# Patient Record
Sex: Female | Born: 1935 | Hispanic: No | Marital: Married | Smoking: Never smoker
Health system: Southern US, Community
[De-identification: ages and names within clinical notes are randomized; demographics above are authoritative.]

## PROBLEM LIST (undated history)

## (undated) DIAGNOSIS — N838 Other noninflammatory disorders of ovary, fallopian tube and broad ligament: Secondary | ICD-10-CM

## (undated) DIAGNOSIS — I10 Essential (primary) hypertension: Secondary | ICD-10-CM

## (undated) DIAGNOSIS — E785 Hyperlipidemia, unspecified: Secondary | ICD-10-CM

## (undated) DIAGNOSIS — C801 Malignant (primary) neoplasm, unspecified: Secondary | ICD-10-CM

## (undated) DIAGNOSIS — I509 Heart failure, unspecified: Secondary | ICD-10-CM

## (undated) HISTORY — PX: COLONOSCOPY: SHX174

## (undated) HISTORY — PX: COLONOSCOPY W/ BIOPSIES: SHX1374

## (undated) HISTORY — DX: Essential (primary) hypertension: I10

## (undated) HISTORY — DX: Malignant (primary) neoplasm, unspecified: C80.1

## (undated) HISTORY — DX: Heart failure, unspecified: I50.9

## (undated) HISTORY — PX: JOINT REPLACEMENT: SHX530

## (undated) HISTORY — DX: Other noninflammatory disorders of ovary, fallopian tube and broad ligament: N83.8

## (undated) HISTORY — DX: Hyperlipidemia, unspecified: E78.5

---

## 2011-02-24 ENCOUNTER — Emergency Department: Payer: Self-pay | Admitting: Emergency Medicine

## 2011-02-26 ENCOUNTER — Ambulatory Visit: Payer: Self-pay | Admitting: Oncology

## 2011-02-28 ENCOUNTER — Ambulatory Visit: Payer: Self-pay | Admitting: Oncology

## 2011-03-01 LAB — CEA: CEA: 32.2 ng/mL — ABNORMAL HIGH (ref 0.0–4.7)

## 2011-03-10 ENCOUNTER — Ambulatory Visit: Payer: Self-pay | Admitting: Unknown Physician Specialty

## 2011-03-13 ENCOUNTER — Ambulatory Visit: Payer: Self-pay | Admitting: Surgery

## 2011-03-14 ENCOUNTER — Ambulatory Visit: Payer: Self-pay | Admitting: Surgery

## 2011-03-21 ENCOUNTER — Inpatient Hospital Stay: Payer: Self-pay | Admitting: Surgery

## 2011-03-24 LAB — PATHOLOGY REPORT

## 2011-03-28 ENCOUNTER — Ambulatory Visit: Payer: Self-pay | Admitting: Oncology

## 2011-04-10 ENCOUNTER — Ambulatory Visit: Payer: Self-pay | Admitting: Surgery

## 2011-04-24 ENCOUNTER — Ambulatory Visit: Payer: Self-pay | Admitting: Oncology

## 2011-04-28 ENCOUNTER — Ambulatory Visit: Payer: Self-pay | Admitting: Oncology

## 2011-05-28 ENCOUNTER — Ambulatory Visit: Payer: Self-pay | Admitting: Oncology

## 2011-06-28 ENCOUNTER — Ambulatory Visit: Payer: Self-pay | Admitting: Oncology

## 2011-07-05 LAB — CEA: CEA: 4.5 ng/mL (ref 0.0–4.7)

## 2011-07-29 ENCOUNTER — Ambulatory Visit: Payer: Self-pay | Admitting: Oncology

## 2011-08-28 ENCOUNTER — Ambulatory Visit: Payer: Self-pay | Admitting: Oncology

## 2011-09-28 ENCOUNTER — Ambulatory Visit: Payer: Self-pay | Admitting: Oncology

## 2011-10-20 ENCOUNTER — Inpatient Hospital Stay: Payer: Self-pay | Admitting: Oncology

## 2011-10-23 LAB — CEA: CEA: 7.2 ng/mL — ABNORMAL HIGH (ref 0.0–4.7)

## 2011-10-23 LAB — CA 125: CA 125: 43.6 U/mL — ABNORMAL HIGH (ref 0.0–34.0)

## 2011-10-28 ENCOUNTER — Ambulatory Visit: Payer: Self-pay | Admitting: Oncology

## 2011-11-28 ENCOUNTER — Ambulatory Visit: Payer: Self-pay | Admitting: Oncology

## 2011-12-05 LAB — CBC CANCER CENTER
Basophil #: 0.1 x10 3/mm (ref 0.0–0.1)
Eosinophil #: 0 x10 3/mm (ref 0.0–0.7)
Eosinophil %: 0.5 %
HCT: 30.1 % — ABNORMAL LOW (ref 35.0–47.0)
HGB: 10.3 g/dL — ABNORMAL LOW (ref 12.0–16.0)
Lymphocyte #: 1.3 x10 3/mm (ref 1.0–3.6)
Lymphocyte %: 19.5 %
MCH: 32.9 pg (ref 26.0–34.0)
MCV: 97 fL (ref 80–100)
Monocyte %: 7.1 %
Neutrophil #: 4.8 x10 3/mm (ref 1.4–6.5)
Neutrophil %: 72.1 %
RBC: 3.12 10*6/uL — ABNORMAL LOW (ref 3.80–5.20)
RDW: 18.4 % — ABNORMAL HIGH (ref 11.5–14.5)

## 2011-12-06 LAB — CEA: CEA: 5.6 ng/mL — ABNORMAL HIGH (ref 0.0–4.7)

## 2011-12-19 LAB — CBC CANCER CENTER
Basophil #: 0 x10 3/mm (ref 0.0–0.1)
Basophil %: 0.6 %
Eosinophil %: 0.2 %
HGB: 11 g/dL — ABNORMAL LOW (ref 12.0–16.0)
Lymphocyte %: 28 %
MCH: 33.4 pg (ref 26.0–34.0)
Monocyte %: 6.2 %
Neutrophil %: 65 %
Platelet: 259 x10 3/mm (ref 150–440)
RBC: 3.28 10*6/uL — ABNORMAL LOW (ref 3.80–5.20)
WBC: 4.9 x10 3/mm (ref 3.6–11.0)

## 2011-12-19 LAB — URINALYSIS, COMPLETE
Bacteria: NONE SEEN
Ketone: NEGATIVE
Leukocyte Esterase: NEGATIVE
Ph: 6 (ref 4.5–8.0)
Protein: NEGATIVE
RBC,UR: <1 /HPF (ref 0–5)
Specific Gravity: 1.016 (ref 1.003–1.030)
Squamous Epithelial: NONE SEEN

## 2011-12-19 LAB — PROTEIN, URINE, RANDOM: Protein, Random Urine: 15 mg/dL — ABNORMAL HIGH (ref 0–12)

## 2011-12-29 ENCOUNTER — Ambulatory Visit: Payer: Self-pay | Admitting: Oncology

## 2012-01-02 LAB — CBC CANCER CENTER
Basophil #: 0 x10 3/mm (ref 0.0–0.1)
Basophil %: 0.6 %
Eosinophil #: 0 x10 3/mm (ref 0.0–0.7)
Eosinophil %: 0.6 %
HCT: 31.7 % — ABNORMAL LOW (ref 35.0–47.0)
HGB: 10.6 g/dL — ABNORMAL LOW (ref 12.0–16.0)
Lymphocyte #: 1.6 x10 3/mm (ref 1.0–3.6)
Lymphocyte %: 29.2 %
MCHC: 33.3 g/dL (ref 32.0–36.0)
Monocyte #: 0.6 x10 3/mm (ref 0.0–0.7)
Monocyte %: 10.9 %
Neutrophil #: 3.2 x10 3/mm (ref 1.4–6.5)
RBC: 3.19 10*6/uL — ABNORMAL LOW (ref 3.80–5.20)
RDW: 14.6 % — ABNORMAL HIGH (ref 11.5–14.5)

## 2012-01-03 LAB — CEA: CEA: 6 ng/mL — ABNORMAL HIGH (ref 0.0–4.7)

## 2012-01-16 LAB — URINALYSIS, COMPLETE
Bacteria: NONE SEEN
Glucose,UR: NEGATIVE mg/dL (ref 0–75)
Leukocyte Esterase: NEGATIVE
Nitrite: NEGATIVE
Ph: 5 (ref 4.5–8.0)
Protein: NEGATIVE
Specific Gravity: 1.011 (ref 1.003–1.030)
WBC UR: 2 /HPF (ref 0–5)

## 2012-01-16 LAB — CBC CANCER CENTER
Basophil #: 0.1 x10 3/mm (ref 0.0–0.1)
Basophil %: 1 %
Eosinophil #: 0 x10 3/mm (ref 0.0–0.7)
HCT: 32.8 % — ABNORMAL LOW (ref 35.0–47.0)
HGB: 10.9 g/dL — ABNORMAL LOW (ref 12.0–16.0)
Lymphocyte %: 21.1 %
MCHC: 33.4 g/dL (ref 32.0–36.0)
MCV: 97 fL (ref 80–100)
Monocyte %: 8.8 %
Neutrophil #: 4.7 x10 3/mm (ref 1.4–6.5)
RDW: 14.1 % (ref 11.5–14.5)
WBC: 6.8 x10 3/mm (ref 3.6–11.0)

## 2012-01-17 LAB — CEA: CEA: 5.5 ng/mL — ABNORMAL HIGH (ref 0.0–4.7)

## 2012-01-26 ENCOUNTER — Ambulatory Visit: Payer: Self-pay | Admitting: Oncology

## 2012-01-30 LAB — BASIC METABOLIC PANEL
Calcium, Total: 8.7 mg/dL (ref 8.5–10.1)
Chloride: 99 mmol/L (ref 98–107)
Co2: 27 mmol/L (ref 21–32)
EGFR (African American): >60
EGFR (Non-African Amer.): >60
Glucose: 76 mg/dL (ref 65–99)
Potassium: 4.3 mmol/L (ref 3.5–5.1)
Sodium: 133 mmol/L — ABNORMAL LOW (ref 136–145)

## 2012-01-30 LAB — URINALYSIS, COMPLETE
Bacteria: NONE SEEN
Glucose,UR: NEGATIVE mg/dL (ref 0–75)
Ketone: NEGATIVE
Leukocyte Esterase: NEGATIVE
Nitrite: NEGATIVE
Specific Gravity: 1.005 (ref 1.003–1.030)
Squamous Epithelial: <1
WBC UR: 1 /HPF (ref 0–5)

## 2012-01-30 LAB — CBC CANCER CENTER
Basophil %: 1.1 %
Eosinophil #: 0.1 x10 3/mm (ref 0.0–0.7)
Eosinophil %: 1.4 %
HGB: 10.3 g/dL — ABNORMAL LOW (ref 12.0–16.0)
Lymphocyte #: 1.3 x10 3/mm (ref 1.0–3.6)
MCH: 31.5 pg (ref 26.0–34.0)
MCHC: 33.2 g/dL (ref 32.0–36.0)
Neutrophil %: 60.7 %
Platelet: 249 x10 3/mm (ref 150–440)
RDW: 13.9 % (ref 11.5–14.5)

## 2012-01-30 LAB — PROTEIN, URINE, RANDOM: Protein, Random Urine: 7 mg/dL (ref 0–12)

## 2012-01-31 LAB — CEA: CEA: 6.2 ng/mL — ABNORMAL HIGH (ref 0.0–4.7)

## 2012-02-13 LAB — URINALYSIS, COMPLETE
Bacteria: NONE SEEN
Bilirubin,UR: NEGATIVE
Glucose,UR: NEGATIVE mg/dL (ref 0–75)
Ketone: NEGATIVE
Nitrite: NEGATIVE
Protein: NEGATIVE
RBC,UR: <1 /HPF (ref 0–5)
Specific Gravity: 1.005 (ref 1.003–1.030)
Squamous Epithelial: <1
WBC UR: <1 /HPF (ref 0–5)

## 2012-02-13 LAB — BASIC METABOLIC PANEL
BUN: 14 mg/dL (ref 7–18)
Chloride: 105 mmol/L (ref 98–107)
Co2: 24 mmol/L (ref 21–32)
Creatinine: 0.78 mg/dL (ref 0.60–1.30)
EGFR (Non-African Amer.): >60
Glucose: 91 mg/dL (ref 65–99)
Osmolality: 279 (ref 275–301)
Potassium: 3.9 mmol/L (ref 3.5–5.1)
Sodium: 140 mmol/L (ref 136–145)

## 2012-02-13 LAB — CBC CANCER CENTER
Basophil %: 0.9 %
Eosinophil %: 1.6 %
HGB: 10.4 g/dL — ABNORMAL LOW (ref 12.0–16.0)
Lymphocyte %: 23.7 %
MCH: 30.9 pg (ref 26.0–34.0)
MCHC: 33.6 g/dL (ref 32.0–36.0)
Monocyte #: 0.5 x10 3/mm (ref 0.0–0.7)
Neutrophil #: 3.8 x10 3/mm (ref 1.4–6.5)
Neutrophil %: 65 %
RDW: 14 % (ref 11.5–14.5)
WBC: 5.9 x10 3/mm (ref 3.6–11.0)

## 2012-02-14 LAB — CEA: CEA: 7.1 ng/mL — ABNORMAL HIGH (ref 0.0–4.7)

## 2012-02-26 ENCOUNTER — Ambulatory Visit: Payer: Self-pay | Admitting: Oncology

## 2012-02-27 LAB — URINALYSIS, COMPLETE
Bacteria: NONE SEEN
Glucose,UR: NEGATIVE mg/dL (ref 0–75)
Hyaline Cast: 6
Ketone: NEGATIVE
Nitrite: NEGATIVE
Ph: 5 (ref 4.5–8.0)
Protein: NEGATIVE
RBC,UR: NONE SEEN /HPF (ref 0–5)
Specific Gravity: 1.006 (ref 1.003–1.030)

## 2012-02-27 LAB — CBC CANCER CENTER
Basophil #: 0.1 x10 3/mm (ref 0.0–0.1)
Basophil %: 1.2 %
Eosinophil %: 0.8 %
HCT: 30.9 % — ABNORMAL LOW (ref 35.0–47.0)
HGB: 10.2 g/dL — ABNORMAL LOW (ref 12.0–16.0)
Lymphocyte #: 1.1 x10 3/mm (ref 1.0–3.6)
MCH: 29.8 pg (ref 26.0–34.0)
Monocyte #: 0.6 x10 3/mm (ref 0.0–0.7)
Neutrophil #: 6 x10 3/mm (ref 1.4–6.5)
Neutrophil %: 76.4 %
Platelet: 270 x10 3/mm (ref 150–440)
RBC: 3.43 10*6/uL — ABNORMAL LOW (ref 3.80–5.20)

## 2012-02-27 LAB — BASIC METABOLIC PANEL
Anion Gap: 12 (ref 7–16)
BUN: 19 mg/dL — ABNORMAL HIGH (ref 7–18)
Calcium, Total: 8.5 mg/dL (ref 8.5–10.1)
Creatinine: 1.12 mg/dL (ref 0.60–1.30)
EGFR (African American): >60
EGFR (Non-African Amer.): 50 — ABNORMAL LOW
Glucose: 190 mg/dL — ABNORMAL HIGH (ref 65–99)
Osmolality: 289 (ref 275–301)

## 2012-02-27 LAB — PROTEIN, URINE, RANDOM: Protein, Random Urine: 9 mg/dL (ref 0–12)

## 2012-03-12 LAB — CBC CANCER CENTER
Basophil %: 2.3 %
Eosinophil #: 0.2 x10 3/mm (ref 0.0–0.7)
Eosinophil %: 2.5 %
HCT: 31.3 % — ABNORMAL LOW (ref 35.0–47.0)
HGB: 10.1 g/dL — ABNORMAL LOW (ref 12.0–16.0)
MCH: 28.7 pg (ref 26.0–34.0)
Monocyte #: 0.6 x10 3/mm (ref 0.2–0.9)
Monocyte %: 9.1 %
Neutrophil #: 3.6 x10 3/mm (ref 1.4–6.5)
Neutrophil %: 58.2 %
Platelet: 267 x10 3/mm (ref 150–440)
RBC: 3.53 10*6/uL — ABNORMAL LOW (ref 3.80–5.20)
RDW: 14.8 % — ABNORMAL HIGH (ref 11.5–14.5)
WBC: 6.2 x10 3/mm (ref 3.6–11.0)

## 2012-03-12 LAB — PROTEIN, URINE, RANDOM: Protein, Random Urine: 14 mg/dL — ABNORMAL HIGH (ref 0–12)

## 2012-03-12 LAB — URINALYSIS, COMPLETE
Blood: NEGATIVE
Glucose,UR: NEGATIVE mg/dL (ref 0–75)
Ketone: NEGATIVE
RBC,UR: 1 /HPF (ref 0–5)
WBC UR: 9 /HPF (ref 0–5)

## 2012-03-12 LAB — BASIC METABOLIC PANEL
BUN: 17 mg/dL (ref 7–18)
EGFR (African American): >60
EGFR (Non-African Amer.): >60
Glucose: 112 mg/dL — ABNORMAL HIGH (ref 65–99)
Osmolality: 282 (ref 275–301)
Sodium: 140 mmol/L (ref 136–145)

## 2012-03-13 LAB — CEA: CEA: 10 ng/mL — ABNORMAL HIGH (ref 0.0–4.7)

## 2012-03-26 LAB — BASIC METABOLIC PANEL
Anion Gap: 13 (ref 7–16)
BUN: 19 mg/dL — ABNORMAL HIGH (ref 7–18)
Calcium, Total: 8.4 mg/dL — ABNORMAL LOW (ref 8.5–10.1)
Creatinine: 0.73 mg/dL (ref 0.60–1.30)
EGFR (African American): >60
Glucose: 102 mg/dL — ABNORMAL HIGH (ref 65–99)
Osmolality: 284 (ref 275–301)
Sodium: 141 mmol/L (ref 136–145)

## 2012-03-26 LAB — CBC CANCER CENTER
Basophil #: 0.1 x10 3/mm (ref 0.0–0.1)
HCT: 30.5 % — ABNORMAL LOW (ref 35.0–47.0)
HGB: 9.6 g/dL — ABNORMAL LOW (ref 12.0–16.0)
Lymphocyte #: 1.4 x10 3/mm (ref 1.0–3.6)
Lymphocyte %: 22.1 %
MCH: 27.4 pg (ref 26.0–34.0)
MCHC: 31.5 g/dL — ABNORMAL LOW (ref 32.0–36.0)
MCV: 87 fL (ref 80–100)
Monocyte #: 0.6 x10 3/mm (ref 0.2–0.9)
Monocyte %: 9.9 %
Neutrophil %: 65.1 %
Platelet: 234 x10 3/mm (ref 150–440)
RDW: 15.4 % — ABNORMAL HIGH (ref 11.5–14.5)

## 2012-03-27 ENCOUNTER — Ambulatory Visit: Payer: Self-pay | Admitting: Oncology

## 2012-03-27 LAB — CEA: CEA: 10.3 ng/mL — ABNORMAL HIGH (ref 0.0–4.7)

## 2012-04-09 LAB — URINALYSIS, COMPLETE
Bacteria: NONE SEEN
Bilirubin,UR: NEGATIVE
Blood: NEGATIVE
Glucose,UR: NEGATIVE mg/dL (ref 0–75)
Leukocyte Esterase: NEGATIVE
Nitrite: NEGATIVE
RBC,UR: <1 /HPF (ref 0–5)
Specific Gravity: 1.011 (ref 1.003–1.030)
WBC UR: <1 /HPF (ref 0–5)

## 2012-04-09 LAB — CREATININE, URINE, RANDOM: Creatinine, Urine Random: 66 mg/dL (ref 30.0–125.0)

## 2012-04-23 LAB — COMPREHENSIVE METABOLIC PANEL
Alkaline Phosphatase: 77 U/L (ref 50–136)
Anion Gap: 11 (ref 7–16)
Bilirubin,Total: 0.3 mg/dL (ref 0.2–1.0)
Co2: 23 mmol/L (ref 21–32)
Creatinine: 0.84 mg/dL (ref 0.60–1.30)
EGFR (African American): >60
EGFR (Non-African Amer.): >60
Osmolality: 282 (ref 275–301)
SGOT(AST): 17 U/L (ref 15–37)
SGPT (ALT): 14 U/L
Sodium: 140 mmol/L (ref 136–145)
Total Protein: 6.8 g/dL (ref 6.4–8.2)

## 2012-04-23 LAB — CBC CANCER CENTER
Basophil #: 0.1 x10 3/mm (ref 0.0–0.1)
Basophil %: 1.2 %
Eosinophil #: 0.1 x10 3/mm (ref 0.0–0.7)
Eosinophil %: 1.1 %
HCT: 31.3 % — ABNORMAL LOW (ref 35.0–47.0)
Lymphocyte %: 19.7 %
MCH: 26.8 pg (ref 26.0–34.0)
MCHC: 31.6 g/dL — ABNORMAL LOW (ref 32.0–36.0)
MCV: 85 fL (ref 80–100)
Neutrophil #: 4.7 x10 3/mm (ref 1.4–6.5)
Neutrophil %: 68.7 %
Platelet: 270 x10 3/mm (ref 150–440)
WBC: 6.9 x10 3/mm (ref 3.6–11.0)

## 2012-04-23 LAB — PROTEIN, URINE, RANDOM: Protein, Random Urine: 28 mg/dL — ABNORMAL HIGH (ref 0–12)

## 2012-04-27 ENCOUNTER — Ambulatory Visit: Payer: Self-pay | Admitting: Oncology

## 2012-05-07 LAB — URINALYSIS, COMPLETE
Blood: NEGATIVE
Glucose,UR: NEGATIVE mg/dL (ref 0–75)
Leukocyte Esterase: NEGATIVE
Nitrite: NEGATIVE
Protein: 30
RBC,UR: 1 /HPF (ref 0–5)
Specific Gravity: 1.019 (ref 1.003–1.030)

## 2012-05-07 LAB — PROTEIN, URINE, RANDOM: Protein, Random Urine: 27 mg/dL — ABNORMAL HIGH (ref 0–12)

## 2012-05-07 LAB — CBC CANCER CENTER
Basophil %: 1.6 %
Eosinophil %: 2 %
HCT: 31.1 % — ABNORMAL LOW (ref 35.0–47.0)
HGB: 9.8 g/dL — ABNORMAL LOW (ref 12.0–16.0)
Lymphocyte #: 1.6 x10 3/mm (ref 1.0–3.6)
Lymphocyte %: 27.4 %
MCV: 84 fL (ref 80–100)
Monocyte #: 0.6 x10 3/mm (ref 0.2–0.9)
Neutrophil #: 3.5 x10 3/mm (ref 1.4–6.5)
Neutrophil %: 59.6 %
Platelet: 259 x10 3/mm (ref 150–440)
RDW: 17 % — ABNORMAL HIGH (ref 11.5–14.5)

## 2012-05-07 LAB — COMPREHENSIVE METABOLIC PANEL
Alkaline Phosphatase: 90 U/L (ref 50–136)
BUN: 16 mg/dL (ref 7–18)
Bilirubin,Total: 0.2 mg/dL (ref 0.2–1.0)
Calcium, Total: 8.6 mg/dL (ref 8.5–10.1)
Chloride: 106 mmol/L (ref 98–107)
Creatinine: 0.68 mg/dL (ref 0.60–1.30)
Glucose: 93 mg/dL (ref 65–99)
Osmolality: 277 (ref 275–301)
Potassium: 4.4 mmol/L (ref 3.5–5.1)
SGPT (ALT): 15 U/L
Sodium: 138 mmol/L (ref 136–145)
Total Protein: 6.6 g/dL (ref 6.4–8.2)

## 2012-05-08 LAB — CEA: CEA: 11.6 ng/mL — ABNORMAL HIGH (ref 0.0–4.7)

## 2012-05-27 ENCOUNTER — Ambulatory Visit: Payer: Self-pay | Admitting: Oncology

## 2012-06-04 LAB — CBC CANCER CENTER
Basophil #: 0.1 x10 3/mm (ref 0.0–0.1)
Basophil %: 1.2 %
Eosinophil #: 0.1 x10 3/mm (ref 0.0–0.7)
Eosinophil %: 1.2 %
HCT: 32.4 % — ABNORMAL LOW (ref 35.0–47.0)
HGB: 10.1 g/dL — ABNORMAL LOW (ref 12.0–16.0)
Lymphocyte #: 1.9 x10 3/mm (ref 1.0–3.6)
Lymphocyte %: 28.9 %
MCH: 25.6 pg — ABNORMAL LOW (ref 26.0–34.0)
MCHC: 31.3 g/dL — ABNORMAL LOW (ref 32.0–36.0)
Neutrophil #: 3.8 x10 3/mm (ref 1.4–6.5)
Neutrophil %: 59.1 %
RBC: 3.97 10*6/uL (ref 3.80–5.20)

## 2012-06-05 LAB — CEA: CEA: 13.6 ng/mL — ABNORMAL HIGH (ref 0.0–4.7)

## 2012-06-27 ENCOUNTER — Ambulatory Visit: Payer: Self-pay | Admitting: Oncology

## 2012-07-15 ENCOUNTER — Ambulatory Visit: Payer: Self-pay | Admitting: Unknown Physician Specialty

## 2012-07-18 ENCOUNTER — Ambulatory Visit: Payer: Self-pay | Admitting: Oncology

## 2012-07-18 LAB — CBC CANCER CENTER
Basophil #: 0.1 x10 3/mm (ref 0.0–0.1)
Eosinophil #: 0.1 x10 3/mm (ref 0.0–0.7)
Lymphocyte #: 1.4 x10 3/mm (ref 1.0–3.6)
MCH: 24.9 pg — ABNORMAL LOW (ref 26.0–34.0)
MCHC: 31.3 g/dL — ABNORMAL LOW (ref 32.0–36.0)
MCV: 80 fL (ref 80–100)
Monocyte #: 0.7 x10 3/mm (ref 0.2–0.9)
Neutrophil %: 64 %
Platelet: 348 x10 3/mm (ref 150–440)
RDW: 17.3 % — ABNORMAL HIGH (ref 11.5–14.5)
WBC: 6.1 x10 3/mm (ref 3.6–11.0)

## 2012-07-28 ENCOUNTER — Ambulatory Visit: Payer: Self-pay | Admitting: Oncology

## 2012-08-14 ENCOUNTER — Emergency Department: Payer: Self-pay | Admitting: *Deleted

## 2012-08-14 LAB — CBC
HCT: 32.3 % — ABNORMAL LOW (ref 35.0–47.0)
HGB: 10.7 g/dL — ABNORMAL LOW (ref 12.0–16.0)
MCHC: 33 g/dL (ref 32.0–36.0)
Platelet: 375 10*3/uL (ref 150–440)
RDW: 17.5 % — ABNORMAL HIGH (ref 11.5–14.5)
WBC: 6.7 10*3/uL (ref 3.6–11.0)

## 2012-08-14 LAB — LIPASE, BLOOD: Lipase: 100 U/L (ref 73–393)

## 2012-08-14 LAB — COMPREHENSIVE METABOLIC PANEL
Albumin: 3.5 g/dL (ref 3.4–5.0)
Alkaline Phosphatase: 80 U/L (ref 50–136)
Anion Gap: 10 (ref 7–16)
BUN: 8 mg/dL (ref 7–18)
Bilirubin,Total: 0.5 mg/dL (ref 0.2–1.0)
Chloride: 100 mmol/L (ref 98–107)
Creatinine: 0.61 mg/dL (ref 0.60–1.30)
EGFR (Non-African Amer.): >60
Glucose: 98 mg/dL (ref 65–99)
Osmolality: 265 (ref 275–301)
SGPT (ALT): 11 U/L — ABNORMAL LOW (ref 12–78)

## 2012-08-15 LAB — URINALYSIS, COMPLETE
Bilirubin,UR: NEGATIVE
Blood: NEGATIVE
Leukocyte Esterase: NEGATIVE
Ph: 5 (ref 4.5–8.0)
Protein: NEGATIVE
RBC,UR: 1 /HPF (ref 0–5)
Specific Gravity: 1.014 (ref 1.003–1.030)
Squamous Epithelial: <1

## 2012-08-15 LAB — TROPONIN I: Troponin-I: 0.08 ng/mL — ABNORMAL HIGH

## 2012-08-21 ENCOUNTER — Ambulatory Visit: Payer: Self-pay | Admitting: Oncology

## 2012-08-21 LAB — COMPREHENSIVE METABOLIC PANEL
Albumin: 3.4 g/dL (ref 3.4–5.0)
Anion Gap: 8 (ref 7–16)
Bilirubin,Total: 0.2 mg/dL (ref 0.2–1.0)
Calcium, Total: 8.8 mg/dL (ref 8.5–10.1)
Co2: 25 mmol/L (ref 21–32)
Creatinine: 0.98 mg/dL (ref 0.60–1.30)
Glucose: 153 mg/dL — ABNORMAL HIGH (ref 65–99)
Osmolality: 261 (ref 275–301)
Potassium: 4.5 mmol/L (ref 3.5–5.1)
Sodium: 129 mmol/L — ABNORMAL LOW (ref 136–145)
Total Protein: 7 g/dL (ref 6.4–8.2)

## 2012-08-21 LAB — CBC CANCER CENTER
Basophil #: 0.1 x10 3/mm (ref 0.0–0.1)
Eosinophil #: 0 x10 3/mm (ref 0.0–0.7)
Eosinophil %: 0.5 %
HCT: 31 % — ABNORMAL LOW (ref 35.0–47.0)
HGB: 9.6 g/dL — ABNORMAL LOW (ref 12.0–16.0)
Lymphocyte %: 15.4 %
MCHC: 31 g/dL — ABNORMAL LOW (ref 32.0–36.0)
MCV: 78 fL — ABNORMAL LOW (ref 80–100)
Monocyte %: 9.5 %
Neutrophil #: 3.6 x10 3/mm (ref 1.4–6.5)
Neutrophil %: 72.9 %
RDW: 17.2 % — ABNORMAL HIGH (ref 11.5–14.5)
WBC: 4.9 x10 3/mm (ref 3.6–11.0)

## 2012-08-27 ENCOUNTER — Ambulatory Visit: Payer: Self-pay | Admitting: Oncology

## 2012-09-04 LAB — CBC CANCER CENTER
Basophil #: 0.1 x10 3/mm (ref 0.0–0.1)
Eosinophil #: 0 x10 3/mm (ref 0.0–0.7)
Eosinophil %: 0.7 %
HGB: 9.9 g/dL — ABNORMAL LOW (ref 12.0–16.0)
Lymphocyte #: 0.7 x10 3/mm — ABNORMAL LOW (ref 1.0–3.6)
Lymphocyte %: 13.8 %
Monocyte #: 0.4 x10 3/mm (ref 0.2–0.9)
Monocyte %: 7.9 %
Neutrophil #: 4 x10 3/mm (ref 1.4–6.5)
Neutrophil %: 75.8 %
Platelet: 321 x10 3/mm (ref 150–440)
RBC: 3.83 10*6/uL (ref 3.80–5.20)
RDW: 19.2 % — ABNORMAL HIGH (ref 11.5–14.5)
WBC: 5.3 x10 3/mm (ref 3.6–11.0)

## 2012-09-04 LAB — COMPREHENSIVE METABOLIC PANEL
Albumin: 3.3 g/dL — ABNORMAL LOW (ref 3.4–5.0)
Alkaline Phosphatase: 80 U/L (ref 50–136)
BUN: 10 mg/dL (ref 7–18)
Bilirubin,Total: 0.3 mg/dL (ref 0.2–1.0)
Calcium, Total: 8.6 mg/dL (ref 8.5–10.1)
Chloride: 97 mmol/L — ABNORMAL LOW (ref 98–107)
Co2: 24 mmol/L (ref 21–32)
EGFR (African American): >60
EGFR (Non-African Amer.): >60
Potassium: 4.4 mmol/L (ref 3.5–5.1)
Sodium: 132 mmol/L — ABNORMAL LOW (ref 136–145)

## 2012-09-06 LAB — COMPREHENSIVE METABOLIC PANEL
Albumin: 3.5 g/dL (ref 3.4–5.0)
Anion Gap: 13 (ref 7–16)
BUN: 13 mg/dL (ref 7–18)
Bilirubin,Total: 0.3 mg/dL (ref 0.2–1.0)
Calcium, Total: 8.8 mg/dL (ref 8.5–10.1)
Chloride: 94 mmol/L — ABNORMAL LOW (ref 98–107)
EGFR (African American): >60
Potassium: 3.7 mmol/L (ref 3.5–5.1)
SGOT(AST): 21 U/L (ref 15–37)
SGPT (ALT): 19 U/L (ref 12–78)
Total Protein: 6.9 g/dL (ref 6.4–8.2)

## 2012-09-06 LAB — CBC CANCER CENTER
Basophil %: 0.8 %
Eosinophil #: 0 x10 3/mm (ref 0.0–0.7)
HGB: 10.1 g/dL — ABNORMAL LOW (ref 12.0–16.0)
Lymphocyte #: 0.9 x10 3/mm — ABNORMAL LOW (ref 1.0–3.6)
MCH: 25 pg — ABNORMAL LOW (ref 26.0–34.0)
MCHC: 31.1 g/dL — ABNORMAL LOW (ref 32.0–36.0)
MCV: 81 fL (ref 80–100)
Monocyte #: 0.7 x10 3/mm (ref 0.2–0.9)
Neutrophil #: 4.5 x10 3/mm (ref 1.4–6.5)
Neutrophil %: 73.6 %

## 2012-09-11 LAB — BASIC METABOLIC PANEL
Anion Gap: 7 (ref 7–16)
Chloride: 95 mmol/L — ABNORMAL LOW (ref 98–107)
Co2: 26 mmol/L (ref 21–32)
Creatinine: 0.65 mg/dL (ref 0.60–1.30)
Glucose: 123 mg/dL — ABNORMAL HIGH (ref 65–99)
Osmolality: 258 (ref 275–301)
Potassium: 4.2 mmol/L (ref 3.5–5.1)
Sodium: 128 mmol/L — ABNORMAL LOW (ref 136–145)

## 2012-09-11 LAB — CBC CANCER CENTER
Basophil #: 0 x10 3/mm (ref 0.0–0.1)
Basophil %: 0.7 %
Eosinophil %: 0.3 %
HCT: 33.9 % — ABNORMAL LOW (ref 35.0–47.0)
Lymphocyte #: 0.6 x10 3/mm — ABNORMAL LOW (ref 1.0–3.6)
MCH: 25.9 pg — ABNORMAL LOW (ref 26.0–34.0)
MCV: 80 fL (ref 80–100)
Monocyte #: 0.6 x10 3/mm (ref 0.2–0.9)
Monocyte %: 10.4 %
Neutrophil #: 4.6 x10 3/mm (ref 1.4–6.5)
Platelet: 318 x10 3/mm (ref 150–440)
RBC: 4.21 10*6/uL (ref 3.80–5.20)
RDW: 19.7 % — ABNORMAL HIGH (ref 11.5–14.5)

## 2012-09-18 LAB — CBC CANCER CENTER
Basophil #: 0.1 x10 3/mm (ref 0.0–0.1)
Eosinophil %: 1.3 %
HCT: 29.1 % — ABNORMAL LOW (ref 35.0–47.0)
Lymphocyte #: 0.6 x10 3/mm — ABNORMAL LOW (ref 1.0–3.6)
Lymphocyte %: 23.3 %
MCH: 25.9 pg — ABNORMAL LOW (ref 26.0–34.0)
Monocyte #: 0.5 x10 3/mm (ref 0.2–0.9)
Neutrophil %: 53.7 %
Platelet: 244 x10 3/mm (ref 150–440)
RBC: 3.63 10*6/uL — ABNORMAL LOW (ref 3.80–5.20)
WBC: 2.7 x10 3/mm — ABNORMAL LOW (ref 3.6–11.0)

## 2012-09-18 LAB — COMPREHENSIVE METABOLIC PANEL
Albumin: 2.9 g/dL — ABNORMAL LOW (ref 3.4–5.0)
Alkaline Phosphatase: 70 U/L (ref 50–136)
BUN: 8 mg/dL (ref 7–18)
Bilirubin,Total: 0.2 mg/dL (ref 0.2–1.0)
Calcium, Total: 8.4 mg/dL — ABNORMAL LOW (ref 8.5–10.1)
Co2: 26 mmol/L (ref 21–32)
EGFR (Non-African Amer.): 58 — ABNORMAL LOW
Glucose: 140 mg/dL — ABNORMAL HIGH (ref 65–99)
SGPT (ALT): 15 U/L (ref 12–78)

## 2012-09-25 LAB — CBC CANCER CENTER
Basophil #: 0.1 x10 3/mm (ref 0.0–0.1)
Basophil %: 2 %
Eosinophil #: 0 x10 3/mm (ref 0.0–0.7)
HCT: 31.1 % — ABNORMAL LOW (ref 35.0–47.0)
HGB: 10.1 g/dL — ABNORMAL LOW (ref 12.0–16.0)
MCH: 26.4 pg (ref 26.0–34.0)
MCHC: 32.5 g/dL (ref 32.0–36.0)
Monocyte #: 0.3 x10 3/mm (ref 0.2–0.9)
Neutrophil #: 1.6 x10 3/mm (ref 1.4–6.5)
Neutrophil %: 56.2 %
RDW: 21.4 % — ABNORMAL HIGH (ref 11.5–14.5)

## 2012-09-27 ENCOUNTER — Ambulatory Visit: Payer: Self-pay | Admitting: Oncology

## 2012-10-02 LAB — COMPREHENSIVE METABOLIC PANEL
Albumin: 3.2 g/dL — ABNORMAL LOW (ref 3.4–5.0)
Anion Gap: 8 (ref 7–16)
BUN: 10 mg/dL (ref 7–18)
Calcium, Total: 8.8 mg/dL (ref 8.5–10.1)
Glucose: 108 mg/dL — ABNORMAL HIGH (ref 65–99)
Osmolality: 273 (ref 275–301)
SGOT(AST): 19 U/L (ref 15–37)
Sodium: 137 mmol/L (ref 136–145)
Total Protein: 6.6 g/dL (ref 6.4–8.2)

## 2012-10-02 LAB — CBC CANCER CENTER
Basophil #: 0.1 x10 3/mm (ref 0.0–0.1)
Eosinophil %: 1.3 %
HCT: 32.8 % — ABNORMAL LOW (ref 35.0–47.0)
Lymphocyte #: 1.1 x10 3/mm (ref 1.0–3.6)
MCHC: 31.6 g/dL — ABNORMAL LOW (ref 32.0–36.0)
MCV: 82 fL (ref 80–100)
Monocyte #: 0.4 x10 3/mm (ref 0.2–0.9)
Monocyte %: 13.5 %
Neutrophil %: 46.6 %
Platelet: 183 x10 3/mm (ref 150–440)
RBC: 4 10*6/uL (ref 3.80–5.20)
WBC: 3.1 x10 3/mm — ABNORMAL LOW (ref 3.6–11.0)

## 2012-10-16 LAB — COMPREHENSIVE METABOLIC PANEL
Albumin: 3.1 g/dL — ABNORMAL LOW (ref 3.4–5.0)
Anion Gap: 9 (ref 7–16)
Calcium, Total: 8.5 mg/dL (ref 8.5–10.1)
Co2: 27 mmol/L (ref 21–32)
Osmolality: 275 (ref 275–301)
Potassium: 3.9 mmol/L (ref 3.5–5.1)
SGPT (ALT): 15 U/L (ref 12–78)
Sodium: 139 mmol/L (ref 136–145)
Total Protein: 6.3 g/dL — ABNORMAL LOW (ref 6.4–8.2)

## 2012-10-16 LAB — CBC CANCER CENTER
Basophil #: 0.1 x10 3/mm (ref 0.0–0.1)
Basophil %: 2 %
Eosinophil #: 0 x10 3/mm (ref 0.0–0.7)
Eosinophil %: 1 %
Lymphocyte %: 51.3 %
MCH: 27.5 pg (ref 26.0–34.0)
MCHC: 32.8 g/dL (ref 32.0–36.0)
Monocyte #: 0.3 x10 3/mm (ref 0.2–0.9)
Neutrophil %: 33.7 %
Platelet: 136 x10 3/mm — ABNORMAL LOW (ref 150–440)
RBC: 3.75 10*6/uL — ABNORMAL LOW (ref 3.80–5.20)
RDW: 24.4 % — ABNORMAL HIGH (ref 11.5–14.5)

## 2012-10-23 LAB — CBC CANCER CENTER
Basophil #: 0.1 x10 3/mm (ref 0.0–0.1)
Eosinophil #: 0 x10 3/mm (ref 0.0–0.7)
Eosinophil %: 1.5 %
HGB: 10.4 g/dL — ABNORMAL LOW (ref 12.0–16.0)
Lymphocyte #: 1.1 x10 3/mm (ref 1.0–3.6)
MCV: 85 fL (ref 80–100)
Monocyte %: 20.8 %
Neutrophil %: 36.1 %
Platelet: 215 x10 3/mm (ref 150–440)
RBC: 3.86 10*6/uL (ref 3.80–5.20)
RDW: 25.8 % — ABNORMAL HIGH (ref 11.5–14.5)
WBC: 2.9 x10 3/mm — ABNORMAL LOW (ref 3.6–11.0)

## 2012-10-23 LAB — COMPREHENSIVE METABOLIC PANEL
Alkaline Phosphatase: 91 U/L (ref 50–136)
Calcium, Total: 8.7 mg/dL (ref 8.5–10.1)
Chloride: 103 mmol/L (ref 98–107)
Co2: 26 mmol/L (ref 21–32)
EGFR (African American): >60
EGFR (Non-African Amer.): >60
Osmolality: 275 (ref 275–301)
SGOT(AST): 19 U/L (ref 15–37)
SGPT (ALT): 15 U/L (ref 12–78)

## 2012-10-27 ENCOUNTER — Ambulatory Visit: Payer: Self-pay | Admitting: Oncology

## 2012-11-06 LAB — CBC CANCER CENTER
Basophil %: 1.1 %
Eosinophil #: 0 x10 3/mm (ref 0.0–0.7)
Eosinophil %: 0.3 %
HCT: 32.9 % — ABNORMAL LOW (ref 35.0–47.0)
HGB: 10.6 g/dL — ABNORMAL LOW (ref 12.0–16.0)
Lymphocyte #: 1 x10 3/mm (ref 1.0–3.6)
MCH: 27.8 pg (ref 26.0–34.0)
MCHC: 32.3 g/dL (ref 32.0–36.0)
Monocyte #: 0.6 x10 3/mm (ref 0.2–0.9)
Neutrophil #: 6.6 x10 3/mm — ABNORMAL HIGH (ref 1.4–6.5)
Neutrophil %: 78.9 %
WBC: 8.4 x10 3/mm (ref 3.6–11.0)

## 2012-11-06 LAB — COMPREHENSIVE METABOLIC PANEL
Albumin: 3.2 g/dL — ABNORMAL LOW (ref 3.4–5.0)
Alkaline Phosphatase: 127 U/L (ref 50–136)
BUN: 8 mg/dL (ref 7–18)
Chloride: 104 mmol/L (ref 98–107)
EGFR (African American): >60
Potassium: 4.4 mmol/L (ref 3.5–5.1)
SGOT(AST): 17 U/L (ref 15–37)
SGPT (ALT): 15 U/L (ref 12–78)
Total Protein: 6.5 g/dL (ref 6.4–8.2)

## 2012-11-07 LAB — CEA: CEA: 9.5 ng/mL — ABNORMAL HIGH (ref 0.0–4.7)

## 2012-11-22 LAB — COMPREHENSIVE METABOLIC PANEL
Anion Gap: 9 (ref 7–16)
Bilirubin,Total: 0.2 mg/dL (ref 0.2–1.0)
Chloride: 103 mmol/L (ref 98–107)
Co2: 25 mmol/L (ref 21–32)
Creatinine: 0.87 mg/dL (ref 0.60–1.30)
EGFR (African American): >60
EGFR (Non-African Amer.): >60
Osmolality: 272 (ref 275–301)
Potassium: 4.3 mmol/L (ref 3.5–5.1)
SGPT (ALT): 16 U/L (ref 12–78)
Total Protein: 6.2 g/dL — ABNORMAL LOW (ref 6.4–8.2)

## 2012-11-22 LAB — CBC CANCER CENTER
Basophil #: 0.1 x10 3/mm (ref 0.0–0.1)
Eosinophil %: 0.4 %
HCT: 31 % — ABNORMAL LOW (ref 35.0–47.0)
Lymphocyte #: 0.9 x10 3/mm — ABNORMAL LOW (ref 1.0–3.6)
Lymphocyte %: 8.5 %
MCV: 89 fL (ref 80–100)
Monocyte %: 7.8 %
Neutrophil #: 8.7 x10 3/mm — ABNORMAL HIGH (ref 1.4–6.5)
Neutrophil %: 82.4 %
Platelet: 196 x10 3/mm (ref 150–440)
RBC: 3.49 10*6/uL — ABNORMAL LOW (ref 3.80–5.20)
RDW: 24.1 % — ABNORMAL HIGH (ref 11.5–14.5)
WBC: 10.6 x10 3/mm (ref 3.6–11.0)

## 2012-11-27 ENCOUNTER — Ambulatory Visit: Payer: Self-pay | Admitting: Oncology

## 2012-12-09 LAB — COMPREHENSIVE METABOLIC PANEL
Albumin: 3.7 g/dL (ref 3.4–5.0)
Anion Gap: 9 (ref 7–16)
BUN: 9 mg/dL (ref 7–18)
Bilirubin,Total: 0.3 mg/dL (ref 0.2–1.0)
Calcium, Total: 9 mg/dL (ref 8.5–10.1)
Chloride: 102 mmol/L (ref 98–107)
Co2: 27 mmol/L (ref 21–32)
EGFR (African American): >60
SGOT(AST): 24 U/L (ref 15–37)
Sodium: 138 mmol/L (ref 136–145)

## 2012-12-09 LAB — CBC CANCER CENTER
Basophil #: 0.2 x10 3/mm — ABNORMAL HIGH (ref 0.0–0.1)
Eosinophil #: 0.1 x10 3/mm (ref 0.0–0.7)
Eosinophil %: 1.7 %
HCT: 36 % (ref 35.0–47.0)
HGB: 11.6 g/dL — ABNORMAL LOW (ref 12.0–16.0)
Lymphocyte #: 1.3 x10 3/mm (ref 1.0–3.6)
Lymphocyte %: 17.8 %
MCH: 29 pg (ref 26.0–34.0)
MCHC: 32.2 g/dL (ref 32.0–36.0)
Monocyte %: 8.9 %
Neutrophil #: 5 x10 3/mm (ref 1.4–6.5)
Platelet: 236 x10 3/mm (ref 150–440)
RBC: 4 10*6/uL (ref 3.80–5.20)
RDW: 20.4 % — ABNORMAL HIGH (ref 11.5–14.5)
WBC: 7.2 x10 3/mm (ref 3.6–11.0)

## 2012-12-09 LAB — URINALYSIS, COMPLETE
Glucose,UR: NEGATIVE mg/dL (ref 0–75)
Leukocyte Esterase: NEGATIVE
Nitrite: NEGATIVE
Protein: NEGATIVE
RBC,UR: NONE SEEN /HPF (ref 0–5)

## 2012-12-28 ENCOUNTER — Ambulatory Visit: Payer: Self-pay | Admitting: Oncology

## 2013-01-06 ENCOUNTER — Ambulatory Visit: Payer: Self-pay | Admitting: Oncology

## 2013-01-06 LAB — CBC CANCER CENTER
Basophil #: 0.1 x10 3/mm (ref 0.0–0.1)
Basophil %: 1.1 %
Eosinophil #: 0.1 x10 3/mm (ref 0.0–0.7)
HCT: 37.3 % (ref 35.0–47.0)
Lymphocyte #: 1.2 x10 3/mm (ref 1.0–3.6)
Lymphocyte %: 19 %
MCH: 29.2 pg (ref 26.0–34.0)
MCHC: 32.7 g/dL (ref 32.0–36.0)
MCV: 89 fL (ref 80–100)
Monocyte #: 0.6 x10 3/mm (ref 0.2–0.9)
Monocyte %: 8.7 %
Neutrophil #: 4.5 x10 3/mm (ref 1.4–6.5)
Neutrophil %: 70.3 %
Platelet: 234 x10 3/mm (ref 150–440)
RBC: 4.18 10*6/uL (ref 3.80–5.20)
WBC: 6.3 x10 3/mm (ref 3.6–11.0)

## 2013-01-06 LAB — COMPREHENSIVE METABOLIC PANEL
Alkaline Phosphatase: 91 U/L (ref 50–136)
Anion Gap: 10 (ref 7–16)
Chloride: 96 mmol/L — ABNORMAL LOW (ref 98–107)
Co2: 29 mmol/L (ref 21–32)
Creatinine: 0.96 mg/dL (ref 0.60–1.30)
EGFR (African American): >60
Potassium: 4.3 mmol/L (ref 3.5–5.1)
SGOT(AST): 18 U/L (ref 15–37)
SGPT (ALT): 16 U/L (ref 12–78)
Sodium: 135 mmol/L — ABNORMAL LOW (ref 136–145)
Total Protein: 7.1 g/dL (ref 6.4–8.2)

## 2013-01-14 ENCOUNTER — Inpatient Hospital Stay: Payer: Self-pay | Admitting: Oncology

## 2013-01-14 LAB — CBC CANCER CENTER
Basophil #: 0.1 x10 3/mm (ref 0.0–0.1)
Basophil %: 0.8 %
Eosinophil #: 0 x10 3/mm (ref 0.0–0.7)
HGB: 12.8 g/dL (ref 12.0–16.0)
Lymphocyte #: 0.7 x10 3/mm — ABNORMAL LOW (ref 1.0–3.6)
Lymphocyte %: 10.8 %
MCH: 29.2 pg (ref 26.0–34.0)
MCHC: 32.7 g/dL (ref 32.0–36.0)
Neutrophil #: 5.6 x10 3/mm (ref 1.4–6.5)
Neutrophil %: 83.3 %
Platelet: 264 x10 3/mm (ref 150–440)
WBC: 6.8 x10 3/mm (ref 3.6–11.0)

## 2013-01-14 LAB — COMPREHENSIVE METABOLIC PANEL
Albumin: 3.5 g/dL (ref 3.4–5.0)
Alkaline Phosphatase: 88 U/L (ref 50–136)
Anion Gap: 10 (ref 7–16)
Calcium, Total: 9 mg/dL (ref 8.5–10.1)
Chloride: 95 mmol/L — ABNORMAL LOW (ref 98–107)
Co2: 26 mmol/L (ref 21–32)
Creatinine: 0.86 mg/dL (ref 0.60–1.30)
EGFR (African American): >60
Glucose: 108 mg/dL — ABNORMAL HIGH (ref 65–99)
Potassium: 4.4 mmol/L (ref 3.5–5.1)
SGOT(AST): 17 U/L (ref 15–37)
SGPT (ALT): 13 U/L (ref 12–78)
Total Protein: 6.9 g/dL (ref 6.4–8.2)

## 2013-01-14 LAB — LIPASE, BLOOD: Lipase: 54 U/L — ABNORMAL LOW (ref 73–393)

## 2013-01-14 LAB — AMYLASE: Amylase: 27 U/L (ref 25–115)

## 2013-01-15 LAB — CBC WITH DIFFERENTIAL/PLATELET
Basophil #: 0 10*3/uL (ref 0.0–0.1)
Eosinophil %: 0 %
HCT: 31.8 % — ABNORMAL LOW (ref 35.0–47.0)
HGB: 10.5 g/dL — ABNORMAL LOW (ref 12.0–16.0)
Lymphocyte #: 0.7 10*3/uL — ABNORMAL LOW (ref 1.0–3.6)
Lymphocyte %: 16.5 %
MCH: 29.6 pg (ref 26.0–34.0)
MCHC: 33.1 g/dL (ref 32.0–36.0)
MCV: 89 fL (ref 80–100)
Monocyte #: 0.4 x10 3/mm (ref 0.2–0.9)
Monocyte %: 10.1 %
Neutrophil %: 72.6 %
RBC: 3.56 10*6/uL — ABNORMAL LOW (ref 3.80–5.20)
WBC: 4.1 10*3/uL (ref 3.6–11.0)

## 2013-01-15 LAB — BASIC METABOLIC PANEL
Anion Gap: 13 (ref 7–16)
BUN: 8 mg/dL (ref 7–18)
Co2: 21 mmol/L (ref 21–32)
EGFR (African American): >60
EGFR (Non-African Amer.): >60
Glucose: 86 mg/dL (ref 65–99)
Osmolality: 268 (ref 275–301)
Potassium: 3.9 mmol/L (ref 3.5–5.1)
Sodium: 135 mmol/L — ABNORMAL LOW (ref 136–145)

## 2013-01-16 DIAGNOSIS — I4891 Unspecified atrial fibrillation: Secondary | ICD-10-CM

## 2013-01-17 DIAGNOSIS — I369 Nonrheumatic tricuspid valve disorder, unspecified: Secondary | ICD-10-CM

## 2013-01-17 LAB — CBC WITH DIFFERENTIAL/PLATELET
Eosinophil %: 0 %
HCT: 31.4 % — ABNORMAL LOW (ref 35.0–47.0)
HGB: 10.1 g/dL — ABNORMAL LOW (ref 12.0–16.0)
Lymphocyte #: 0.4 10*3/uL — ABNORMAL LOW (ref 1.0–3.6)
Lymphocyte %: 9.7 %
MCH: 28.9 pg (ref 26.0–34.0)
MCV: 90 fL (ref 80–100)
Monocyte #: 0.5 x10 3/mm (ref 0.2–0.9)
Monocyte %: 13.5 %
Neutrophil #: 2.8 10*3/uL (ref 1.4–6.5)
Neutrophil %: 76.5 %

## 2013-01-17 LAB — BASIC METABOLIC PANEL
BUN: 9 mg/dL (ref 7–18)
Chloride: 105 mmol/L (ref 98–107)
Co2: 14 mmol/L — ABNORMAL LOW (ref 21–32)
EGFR (African American): >60
EGFR (Non-African Amer.): >60
Glucose: 79 mg/dL (ref 65–99)
Osmolality: 271 (ref 275–301)
Sodium: 137 mmol/L (ref 136–145)

## 2013-01-17 LAB — MAGNESIUM: Magnesium: 1.6 mg/dL — ABNORMAL LOW

## 2013-01-17 LAB — POTASSIUM: Potassium: 3.8 mmol/L (ref 3.5–5.1)

## 2013-01-18 LAB — BASIC METABOLIC PANEL
Calcium, Total: 8.4 mg/dL — ABNORMAL LOW (ref 8.5–10.1)
Chloride: 111 mmol/L — ABNORMAL HIGH (ref 98–107)
EGFR (African American): >60
EGFR (Non-African Amer.): >60
Glucose: 115 mg/dL — ABNORMAL HIGH (ref 65–99)
Osmolality: 281 (ref 275–301)

## 2013-01-18 LAB — MAGNESIUM: Magnesium: 2.3 mg/dL

## 2013-01-19 LAB — COMPREHENSIVE METABOLIC PANEL
Albumin: 2.1 g/dL — ABNORMAL LOW (ref 3.4–5.0)
Alkaline Phosphatase: 58 U/L (ref 50–136)
BUN: 6 mg/dL — ABNORMAL LOW (ref 7–18)
Bilirubin,Total: 0.4 mg/dL (ref 0.2–1.0)
Calcium, Total: 7.7 mg/dL — ABNORMAL LOW (ref 8.5–10.1)
Co2: 22 mmol/L (ref 21–32)
Creatinine: 0.52 mg/dL — ABNORMAL LOW (ref 0.60–1.30)
EGFR (African American): >60
EGFR (Non-African Amer.): >60
Glucose: 155 mg/dL — ABNORMAL HIGH (ref 65–99)
SGOT(AST): 13 U/L — ABNORMAL LOW (ref 15–37)
Sodium: 136 mmol/L (ref 136–145)
Total Protein: 5 g/dL — ABNORMAL LOW (ref 6.4–8.2)

## 2013-01-19 LAB — CBC WITH DIFFERENTIAL/PLATELET
Basophil #: 0 10*3/uL (ref 0.0–0.1)
Eosinophil #: 0 10*3/uL (ref 0.0–0.7)
Eosinophil %: 0 %
HCT: 29.4 % — ABNORMAL LOW (ref 35.0–47.0)
MCH: 28.8 pg (ref 26.0–34.0)
MCHC: 32.7 g/dL (ref 32.0–36.0)
MCV: 88 fL (ref 80–100)
Monocyte #: 0.5 x10 3/mm (ref 0.2–0.9)
Monocyte %: 11 %
Neutrophil #: 3.6 10*3/uL (ref 1.4–6.5)
Neutrophil %: 80.4 %
Platelet: 206 10*3/uL (ref 150–440)
RDW: 15.6 % — ABNORMAL HIGH (ref 11.5–14.5)
WBC: 4.5 10*3/uL (ref 3.6–11.0)

## 2013-01-20 LAB — BASIC METABOLIC PANEL
Calcium, Total: 7.5 mg/dL — ABNORMAL LOW (ref 8.5–10.1)
Chloride: 103 mmol/L (ref 98–107)
Creatinine: 0.4 mg/dL — ABNORMAL LOW (ref 0.60–1.30)
Osmolality: 269 (ref 275–301)

## 2013-01-21 LAB — MAGNESIUM: Magnesium: 1.6 mg/dL — ABNORMAL LOW

## 2013-01-21 LAB — POTASSIUM: Potassium: 3.1 mmol/L — ABNORMAL LOW (ref 3.5–5.1)

## 2013-01-22 LAB — BASIC METABOLIC PANEL
Anion Gap: 8 (ref 7–16)
BUN: 5 mg/dL — ABNORMAL LOW (ref 7–18)
Calcium, Total: 8.1 mg/dL — ABNORMAL LOW (ref 8.5–10.1)
Chloride: 103 mmol/L (ref 98–107)
Co2: 26 mmol/L (ref 21–32)
Osmolality: 271 (ref 275–301)
Potassium: 3.5 mmol/L (ref 3.5–5.1)
Sodium: 137 mmol/L (ref 136–145)

## 2013-01-22 LAB — CBC WITH DIFFERENTIAL/PLATELET
Basophil #: 0 10*3/uL (ref 0.0–0.1)
Basophil %: 0.7 %
Eosinophil #: 0 10*3/uL (ref 0.0–0.7)
Lymphocyte %: 24.6 %
MCH: 28.7 pg (ref 26.0–34.0)
MCV: 87 fL (ref 80–100)
Monocyte %: 10.7 %
Neutrophil #: 2.2 10*3/uL (ref 1.4–6.5)
RBC: 3.98 10*6/uL (ref 3.80–5.20)
RDW: 15.7 % — ABNORMAL HIGH (ref 11.5–14.5)
WBC: 3.5 10*3/uL — ABNORMAL LOW (ref 3.6–11.0)

## 2013-01-23 LAB — COMPREHENSIVE METABOLIC PANEL
Albumin: 2.1 g/dL — ABNORMAL LOW (ref 3.4–5.0)
Alkaline Phosphatase: 62 U/L (ref 50–136)
BUN: 6 mg/dL — ABNORMAL LOW (ref 7–18)
Calcium, Total: 8.1 mg/dL — ABNORMAL LOW (ref 8.5–10.1)
Chloride: 101 mmol/L (ref 98–107)
Creatinine: 0.44 mg/dL — ABNORMAL LOW (ref 0.60–1.30)
EGFR (African American): >60
EGFR (Non-African Amer.): >60
Glucose: 80 mg/dL (ref 65–99)
Osmolality: 270 (ref 275–301)
Potassium: 3.6 mmol/L (ref 3.5–5.1)
SGOT(AST): 16 U/L (ref 15–37)
Sodium: 137 mmol/L (ref 136–145)

## 2013-01-23 LAB — CBC WITH DIFFERENTIAL/PLATELET
Basophil #: 0 10*3/uL (ref 0.0–0.1)
Basophil %: 0.7 %
Eosinophil %: 1.4 %
HGB: 10.4 g/dL — ABNORMAL LOW (ref 12.0–16.0)
MCH: 28.5 pg (ref 26.0–34.0)
MCV: 87 fL (ref 80–100)
Monocyte #: 0.6 x10 3/mm (ref 0.2–0.9)
Neutrophil #: 2.2 10*3/uL (ref 1.4–6.5)
Platelet: 236 10*3/uL (ref 150–440)
RBC: 3.66 10*6/uL — ABNORMAL LOW (ref 3.80–5.20)

## 2013-01-25 ENCOUNTER — Ambulatory Visit: Payer: Self-pay | Admitting: Oncology

## 2013-01-25 LAB — CBC WITH DIFFERENTIAL/PLATELET
Basophil #: 0.1 10*3/uL (ref 0.0–0.1)
Basophil %: 1.3 %
Eosinophil #: 0 10*3/uL (ref 0.0–0.7)
Lymphocyte #: 1.1 10*3/uL (ref 1.0–3.6)
Lymphocyte %: 21 %
MCH: 29.2 pg (ref 26.0–34.0)
MCHC: 33.5 g/dL (ref 32.0–36.0)
MCV: 87 fL (ref 80–100)
Monocyte #: 0.7 x10 3/mm (ref 0.2–0.9)
Neutrophil %: 63.2 %
Platelet: 290 10*3/uL (ref 150–440)
RDW: 15.9 % — ABNORMAL HIGH (ref 11.5–14.5)
WBC: 5.1 10*3/uL (ref 3.6–11.0)

## 2013-01-25 LAB — COMPREHENSIVE METABOLIC PANEL
Alkaline Phosphatase: 71 U/L (ref 50–136)
BUN: 12 mg/dL (ref 7–18)
Bilirubin,Total: 0.4 mg/dL (ref 0.2–1.0)
Chloride: 96 mmol/L — ABNORMAL LOW (ref 98–107)
Co2: 29 mmol/L (ref 21–32)
EGFR (African American): >60
EGFR (Non-African Amer.): >60
Potassium: 3.9 mmol/L (ref 3.5–5.1)
SGPT (ALT): 11 U/L — ABNORMAL LOW (ref 12–78)
Sodium: 131 mmol/L — ABNORMAL LOW (ref 136–145)

## 2013-01-25 LAB — LIPASE, BLOOD: Lipase: 71 U/L — ABNORMAL LOW (ref 73–393)

## 2013-01-26 LAB — BASIC METABOLIC PANEL
Anion Gap: 6 — ABNORMAL LOW (ref 7–16)
BUN: 11 mg/dL (ref 7–18)
Calcium, Total: 8.1 mg/dL — ABNORMAL LOW (ref 8.5–10.1)
Co2: 29 mmol/L (ref 21–32)
Creatinine: 0.57 mg/dL — ABNORMAL LOW (ref 0.60–1.30)
EGFR (African American): >60
Glucose: 69 mg/dL (ref 65–99)
Osmolality: 262 (ref 275–301)
Potassium: 3.8 mmol/L (ref 3.5–5.1)

## 2013-01-28 LAB — BASIC METABOLIC PANEL
BUN: 9 mg/dL (ref 7–18)
Calcium, Total: 7.7 mg/dL — ABNORMAL LOW (ref 8.5–10.1)
Creatinine: 0.49 mg/dL — ABNORMAL LOW (ref 0.60–1.30)
EGFR (African American): >60
Osmolality: 256 (ref 275–301)
Sodium: 129 mmol/L — ABNORMAL LOW (ref 136–145)

## 2013-01-28 LAB — MAGNESIUM: Magnesium: 1.8 mg/dL

## 2013-01-29 LAB — BASIC METABOLIC PANEL
Anion Gap: 9 (ref 7–16)
Chloride: 93 mmol/L — ABNORMAL LOW (ref 98–107)
Co2: 24 mmol/L (ref 21–32)
Glucose: 128 mg/dL — ABNORMAL HIGH (ref 65–99)
Potassium: 3.6 mmol/L (ref 3.5–5.1)
Sodium: 126 mmol/L — ABNORMAL LOW (ref 136–145)

## 2013-01-30 LAB — CBC WITH DIFFERENTIAL/PLATELET
Basophil #: 0 10*3/uL (ref 0.0–0.1)
Basophil %: 0.4 %
Eosinophil #: 0 10*3/uL (ref 0.0–0.7)
HGB: 10.6 g/dL — ABNORMAL LOW (ref 12.0–16.0)
Lymphocyte %: 17.4 %
MCHC: 33.3 g/dL (ref 32.0–36.0)
MCV: 86 fL (ref 80–100)
Monocyte %: 8 %
Neutrophil #: 4.8 10*3/uL (ref 1.4–6.5)
Neutrophil %: 74 %
RDW: 16 % — ABNORMAL HIGH (ref 11.5–14.5)

## 2013-01-30 LAB — HEPATIC FUNCTION PANEL A (ARMC)
Albumin: 2.1 g/dL — ABNORMAL LOW (ref 3.4–5.0)
Alkaline Phosphatase: 75 U/L (ref 50–136)
SGOT(AST): 14 U/L — ABNORMAL LOW (ref 15–37)
Total Protein: 4.8 g/dL — ABNORMAL LOW (ref 6.4–8.2)

## 2013-01-30 LAB — BASIC METABOLIC PANEL
Calcium, Total: 7.4 mg/dL — ABNORMAL LOW (ref 8.5–10.1)
Creatinine: 0.57 mg/dL — ABNORMAL LOW (ref 0.60–1.30)
EGFR (African American): >60
Glucose: 135 mg/dL — ABNORMAL HIGH (ref 65–99)
Osmolality: 262 (ref 275–301)

## 2013-01-30 LAB — PROTIME-INR
INR: 1
Prothrombin Time: 13.3 secs (ref 11.5–14.7)

## 2013-01-31 LAB — TPN PANEL
Alkaline Phosphatase: 71 U/L (ref 50–136)
BUN: 8 mg/dL (ref 7–18)
Chloride: 99 mmol/L (ref 98–107)
Cholesterol: 130 mg/dL (ref 0–200)
Co2: 26 mmol/L (ref 21–32)
Creatinine: 0.56 mg/dL — ABNORMAL LOW (ref 0.60–1.30)
EGFR (African American): >60
Glucose: 176 mg/dL — ABNORMAL HIGH (ref 65–99)
INR: 1.1
Magnesium: 1.9 mg/dL
Osmolality: 267 (ref 275–301)
Phosphorus: 2.2 mg/dL — ABNORMAL LOW (ref 2.5–4.9)
Potassium: 4.4 mmol/L (ref 3.5–5.1)
SGOT(AST): 16 U/L (ref 15–37)
Triglycerides: 64 mg/dL (ref 0–200)

## 2013-02-01 LAB — PHOSPHORUS: Phosphorus: 2.4 mg/dL — ABNORMAL LOW (ref 2.5–4.9)

## 2013-02-01 LAB — BASIC METABOLIC PANEL
Anion Gap: 9 (ref 7–16)
BUN: 8 mg/dL (ref 7–18)
Chloride: 100 mmol/L (ref 98–107)
Creatinine: 0.53 mg/dL — ABNORMAL LOW (ref 0.60–1.30)
EGFR (African American): >60
EGFR (Non-African Amer.): >60
Glucose: 183 mg/dL — ABNORMAL HIGH (ref 65–99)
Potassium: 4.3 mmol/L (ref 3.5–5.1)
Sodium: 131 mmol/L — ABNORMAL LOW (ref 136–145)

## 2013-02-02 LAB — PHOSPHORUS: Phosphorus: 2 mg/dL — ABNORMAL LOW (ref 2.5–4.9)

## 2013-02-02 LAB — SODIUM: Sodium: 129 mmol/L — ABNORMAL LOW (ref 136–145)

## 2013-02-02 LAB — POTASSIUM: Potassium: 4.1 mmol/L (ref 3.5–5.1)

## 2013-02-03 LAB — PHOSPHORUS: Phosphorus: 2.5 mg/dL (ref 2.5–4.9)

## 2013-02-03 LAB — MAGNESIUM: Magnesium: 1.7 mg/dL — ABNORMAL LOW

## 2013-02-04 LAB — CALCIUM: Calcium, Total: 7.1 mg/dL — ABNORMAL LOW (ref 8.5–10.1)

## 2013-02-04 LAB — PHOSPHORUS: Phosphorus: 2.9 mg/dL (ref 2.5–4.9)

## 2013-02-04 LAB — ALBUMIN: Albumin: 1.6 g/dL — ABNORMAL LOW (ref 3.4–5.0)

## 2013-02-04 LAB — SODIUM: Sodium: 130 mmol/L — ABNORMAL LOW (ref 136–145)

## 2013-02-05 LAB — SODIUM: Sodium: 132 mmol/L — ABNORMAL LOW (ref 136–145)

## 2013-02-05 LAB — CREATININE, SERUM
Creatinine: 0.32 mg/dL — ABNORMAL LOW (ref 0.60–1.30)
EGFR (Non-African Amer.): >60

## 2013-02-05 LAB — CALCIUM: Calcium, Total: 7.1 mg/dL — ABNORMAL LOW (ref 8.5–10.1)

## 2013-02-05 LAB — PHOSPHORUS: Phosphorus: 3.4 mg/dL (ref 2.5–4.9)

## 2013-02-05 LAB — POTASSIUM: Potassium: 4.5 mmol/L (ref 3.5–5.1)

## 2013-02-06 LAB — POTASSIUM: Potassium: 4.1 mmol/L (ref 3.5–5.1)

## 2013-02-06 LAB — CALCIUM: Calcium, Total: 7.4 mg/dL — ABNORMAL LOW (ref 8.5–10.1)

## 2013-02-06 LAB — PHOSPHORUS: Phosphorus: 3.3 mg/dL (ref 2.5–4.9)

## 2013-02-06 LAB — MAGNESIUM: Magnesium: 1.7 mg/dL — ABNORMAL LOW

## 2013-02-07 LAB — POTASSIUM: Potassium: 4.1 mmol/L (ref 3.5–5.1)

## 2013-02-07 LAB — CALCIUM: Calcium, Total: 7.3 mg/dL — ABNORMAL LOW (ref 8.5–10.1)

## 2013-02-07 LAB — PHOSPHORUS: Phosphorus: 3.2 mg/dL (ref 2.5–4.9)

## 2013-02-07 LAB — SODIUM: Sodium: 133 mmol/L — ABNORMAL LOW (ref 136–145)

## 2013-02-08 LAB — BASIC METABOLIC PANEL
Anion Gap: 1 — ABNORMAL LOW (ref 7–16)
BUN: 12 mg/dL (ref 7–18)
Calcium, Total: 7.3 mg/dL — ABNORMAL LOW (ref 8.5–10.1)
Co2: 30 mmol/L (ref 21–32)
EGFR (African American): >60
Potassium: 4.2 mmol/L (ref 3.5–5.1)

## 2013-02-08 LAB — MAGNESIUM: Magnesium: 2.5 mg/dL — ABNORMAL HIGH

## 2013-02-08 LAB — PHOSPHORUS: Phosphorus: 3.4 mg/dL (ref 2.5–4.9)

## 2013-02-09 LAB — CBC WITH DIFFERENTIAL/PLATELET
Basophil %: 1.9 %
Eosinophil #: 0.1 10*3/uL (ref 0.0–0.7)
Eosinophil %: 1.7 %
HGB: 7.5 g/dL — ABNORMAL LOW (ref 12.0–16.0)
Lymphocyte #: 1.1 10*3/uL (ref 1.0–3.6)
Lymphocyte %: 32.3 %
MCHC: 32.6 g/dL (ref 32.0–36.0)
Monocyte #: 0.4 x10 3/mm (ref 0.2–0.9)
Neutrophil #: 1.7 10*3/uL (ref 1.4–6.5)
Platelet: 272 10*3/uL (ref 150–440)
RDW: 16.5 % — ABNORMAL HIGH (ref 11.5–14.5)
WBC: 3.4 10*3/uL — ABNORMAL LOW (ref 3.6–11.0)

## 2013-02-09 LAB — CALCIUM: Calcium, Total: 7.3 mg/dL — ABNORMAL LOW (ref 8.5–10.1)

## 2013-02-09 LAB — POTASSIUM: Potassium: 4.3 mmol/L (ref 3.5–5.1)

## 2013-02-09 LAB — ALBUMIN: Albumin: 1.6 g/dL — ABNORMAL LOW (ref 3.4–5.0)

## 2013-02-10 LAB — CALCIUM: Calcium, Total: 7.6 mg/dL — ABNORMAL LOW (ref 8.5–10.1)

## 2013-02-10 LAB — CBC WITH DIFFERENTIAL/PLATELET
Basophil #: 0.1 10*3/uL (ref 0.0–0.1)
Basophil %: 1.5 %
HGB: 10 g/dL — ABNORMAL LOW (ref 12.0–16.0)
Lymphocyte #: 0.9 10*3/uL — ABNORMAL LOW (ref 1.0–3.6)
MCHC: 32.9 g/dL (ref 32.0–36.0)
MCV: 86 fL (ref 80–100)
Neutrophil #: 2.8 10*3/uL (ref 1.4–6.5)
RBC: 3.55 10*6/uL — ABNORMAL LOW (ref 3.80–5.20)
RDW: 16.2 % — ABNORMAL HIGH (ref 11.5–14.5)
WBC: 4.4 10*3/uL (ref 3.6–11.0)

## 2013-02-10 LAB — POTASSIUM: Potassium: 4.1 mmol/L (ref 3.5–5.1)

## 2013-02-10 LAB — MAGNESIUM: Magnesium: 1.9 mg/dL

## 2013-02-10 LAB — PHOSPHORUS: Phosphorus: 4.2 mg/dL (ref 2.5–4.9)

## 2013-02-11 LAB — MAGNESIUM: Magnesium: 1.9 mg/dL

## 2013-02-11 LAB — CALCIUM: Calcium, Total: 7.8 mg/dL — ABNORMAL LOW (ref 8.5–10.1)

## 2013-02-11 LAB — SODIUM: Sodium: 134 mmol/L — ABNORMAL LOW (ref 136–145)

## 2013-02-11 LAB — PHOSPHORUS: Phosphorus: 3.5 mg/dL (ref 2.5–4.9)

## 2013-02-12 LAB — CREATININE, SERUM
Creatinine: 0.28 mg/dL — ABNORMAL LOW (ref 0.60–1.30)
EGFR (African American): >60
EGFR (Non-African Amer.): >60

## 2013-02-12 LAB — CALCIUM: Calcium, Total: 7.6 mg/dL — ABNORMAL LOW (ref 8.5–10.1)

## 2013-02-12 LAB — MAGNESIUM: Magnesium: 1.9 mg/dL

## 2013-02-12 LAB — SODIUM: Sodium: 136 mmol/L (ref 136–145)

## 2013-02-13 LAB — SODIUM: Sodium: 135 mmol/L — ABNORMAL LOW (ref 136–145)

## 2013-02-13 LAB — ALBUMIN: Albumin: 1.9 g/dL — ABNORMAL LOW (ref 3.4–5.0)

## 2013-02-13 LAB — POTASSIUM: Potassium: 4 mmol/L (ref 3.5–5.1)

## 2013-02-13 LAB — PHOSPHORUS: Phosphorus: 3.6 mg/dL (ref 2.5–4.9)

## 2013-02-14 LAB — MAGNESIUM: Magnesium: 2 mg/dL

## 2013-02-14 LAB — PHOSPHORUS: Phosphorus: 3.9 mg/dL (ref 2.5–4.9)

## 2013-02-15 ENCOUNTER — Ambulatory Visit: Payer: Self-pay | Admitting: Oncology

## 2013-02-17 ENCOUNTER — Encounter: Payer: Self-pay | Admitting: Internal Medicine

## 2013-02-17 ENCOUNTER — Non-Acute Institutional Stay (SKILLED_NURSING_FACILITY): Payer: Medicare Other | Admitting: Internal Medicine

## 2013-02-17 DIAGNOSIS — K56609 Unspecified intestinal obstruction, unspecified as to partial versus complete obstruction: Secondary | ICD-10-CM

## 2013-02-17 DIAGNOSIS — D630 Anemia in neoplastic disease: Secondary | ICD-10-CM

## 2013-02-17 DIAGNOSIS — L8990 Pressure ulcer of unspecified site, unspecified stage: Secondary | ICD-10-CM

## 2013-02-17 DIAGNOSIS — N838 Other noninflammatory disorders of ovary, fallopian tube and broad ligament: Secondary | ICD-10-CM

## 2013-02-17 DIAGNOSIS — R531 Weakness: Secondary | ICD-10-CM

## 2013-02-17 DIAGNOSIS — C180 Malignant neoplasm of cecum: Secondary | ICD-10-CM

## 2013-02-17 DIAGNOSIS — R5381 Other malaise: Secondary | ICD-10-CM

## 2013-02-17 DIAGNOSIS — N839 Noninflammatory disorder of ovary, fallopian tube and broad ligament, unspecified: Secondary | ICD-10-CM

## 2013-02-17 DIAGNOSIS — E876 Hypokalemia: Secondary | ICD-10-CM | POA: Insufficient documentation

## 2013-02-17 DIAGNOSIS — I10 Essential (primary) hypertension: Secondary | ICD-10-CM

## 2013-02-17 NOTE — Assessment & Plan Note (Signed)
Identified this admission and dr Hyacinth Meeker following on this.

## 2013-02-17 NOTE — Assessment & Plan Note (Signed)
Here for rehab. Will have her work with PT/OT. Fall precautions. Continue nutritional supplements. Encouraged po intake. Pt has good insight into her medical issues.

## 2013-02-17 NOTE — Assessment & Plan Note (Signed)
Will continue kcl supplement and check bmp

## 2013-02-17 NOTE — Assessment & Plan Note (Signed)
Has pressure ulcer on her spine. Will continue wound care and will have cushion placed on her wheelchair for pressure relief. Has gel mattress. Frequent positioning to prevent further pressure ulcer from developing. Also has stage 1 ulcer on her sacral area

## 2013-02-17 NOTE — Progress Notes (Signed)
Patient ID: Jamie Fernandez, female   DOB: Oct 29, 1928, 79 y.o.   MRN: [id number]    PCP: No primary provider on file.  Code Status: full code  Allergies  Allergen Reactions  . Aspirin     Chief Complaint: new admit post hospitalization  HPI:  79 y/o female patient with history of htn and adenocarcinoma of colon was admitted with small bowel obstruction and failed conservative management with NG tube suction. She then underwent laproscopic adhesion lysis and then also required small bowel bypass. She was then placed on TPN and her diet was slowly advanced and now she is off TPN. She also required 1 u prbc transfusion in hospital. Once medically improved, given her weakness she was sent to SNF for STR. Pt was seen in her room today. Her son is also present this visit. She denies any complaints. See ros. No concerns from staff this visit Reviewed her hospital medications, imaging and labs  Review of Systems:  Review of Systems  Constitutional: Negative for fever and chills.  Eyes: Negative.   Respiratory: Negative for cough.   Cardiovascular: Negative for chest pain, palpitations and leg swelling.  Gastrointestinal: Negative for nausea, vomiting, abdominal pain and diarrhea.  Genitourinary: Negative.   Skin: Negative for rash.  Neurological: Positive for weakness. Negative for headaches.  Psychiatric/Behavioral: Negative.     Past Medical History  Diagnosis Date  . Hypertension   . Cancer     adenocarcinoma stage iv b of colon with peritoneal studding  . Ovarian mass    Past Surgical History  Procedure Laterality Date  . Joint replacement     Social History:   reports that she has never smoked. She does not have any smokeless tobacco history on file. She reports that she does not drink alcohol or use illicit drugs.  Family History  Problem Relation Age of Onset  . Family history unknown: Yes    Medications: Patient's Medications  New Prescriptions   No medications on  file  Previous Medications   ACETAMINOPHEN (TYLENOL) 325 MG TABLET    Take 650 mg by mouth every 4 (four) hours as needed for pain.   AMOXICILLIN-CLAVULANATE (AUGMENTIN) 875-125 MG PER TABLET    Take 1 tablet by mouth 2 (two) times daily.   CHOLECALCIFEROL (VITAMIN D) 1000 UNITS TABLET    Take 1,000 Units by mouth daily.   FUROSEMIDE (LASIX) 40 MG TABLET    Take 40 mg by mouth daily as needed (swelling/ edema).   HYDRALAZINE (APRESOLINE) 50 MG TABLET    Take 50 mg by mouth 2 (two) times daily with a meal.   LISINOPRIL (PRINIVIL,ZESTRIL) 40 MG TABLET    Take 40 mg by mouth daily.   METOPROLOL SUCCINATE (TOPROL-XL) 25 MG 24 HR TABLET    Take 25 mg by mouth 2 (two) times daily.   ONDANSETRON (ZOFRAN-ODT) 4 MG DISINTEGRATING TABLET    Take 4 mg by mouth every 8 (eight) hours as needed for nausea.   PANTOPRAZOLE (PROTONIX) 40 MG TABLET    Take 40 mg by mouth daily.   POTASSIUM CHLORIDE SA (K-DUR,KLOR-CON) 20 MEQ TABLET    Take 20 mEq by mouth 2 (two) times daily.  Modified Medications   No medications on file  Discontinued Medications   No medications on file     Physical Exam:  Filed Vitals:   02/17/13 1339  BP: 143/66  Pulse: 68  Temp: 98.1 F (36.7 C)  Resp: 18  SpO2: 97%   Physical Exam  Constitutional: She is oriented to person, place, and time.  Frail, elderly patient, pleasant, well groomed  HENT:  Head: Normocephalic and atraumatic.  Mouth/Throat: Oropharynx is clear and moist.  Eyes: Pupils are equal, round, and reactive to light.  Neck: Normal range of motion. Neck supple.  Cardiovascular: Normal rate and regular rhythm.   Pulmonary/Chest: Effort normal and breath sounds normal.  Abdominal: Soft. Bowel sounds are normal. She exhibits no distension. There is no tenderness. There is no rebound and no guarding.  Musculoskeletal: Normal range of motion.  Weakness present, using walker and wheelchair  Neurological: She is alert and oriented to person, place, and time.   Skin: Skin is warm and dry.  Has pressure ulcer in the lumbar spine area and in the sacrum with dressing over it  Psychiatric: She has a normal mood and affect. Her behavior is normal.    Assessment/Plan HTN (hypertension) Blood pressure currently stable. Reviewed medications. Continue lisinopril, hydralazine and metoprolol. Edema is stable. Also on prn lasix. Check bmp  Hypokalemia Will continue kcl supplement and check bmp  Pressure ulcer, unspecified site(707.00) Has pressure ulcer on her spine. Will continue wound care and will have cushion placed on her wheelchair for pressure relief. Has gel mattress. Frequent positioning to prevent further pressure ulcer from developing. Also has stage 1 ulcer on her sacral area  Small bowel obstruction S/p laprotomy with antral lysis and further small bowel bypass. S/p biopsies of the peritoneal studding which were posiitve for colon cancer.off TPN now. Incision site has healed well.has regular bowel movement at present with one this am  Adenocarcinoma of cecum stage, IVb With peritoneal spread. S/p chemotherapy until December 2013. Now s/p 2 surgical procedure for SBO and here for STR. Pt is being followed by her heme-onc.  Ovarian mass Identified this admission and dr Hyacinth Meeker following on this.  Anemia in neoplastic disease S/p 1 u prbc in hospital. Will check cbc  Weakness generalized Here for rehab. Will have her work with PT/OT. Fall precautions. Continue nutritional supplements. Encouraged po intake. Pt has good insight into her medical issues.     Bed Mobility: Moderate Independence, Transfers: Moderate Assist, Walk: Moderate Assist, Wheelchair: Independent  Family/ staff Communication: discussed plan of care with patient, family and nursing supervisor. Pt to work with PT/OT here to regain her strength and stability. Will need to follow with her oncologist.   Goals of care: plan is for pt to return home after completion of  therapy   Labs/tests ordered: cbc, cmp, prealbumin

## 2013-02-17 NOTE — Assessment & Plan Note (Addendum)
S/p laprotomy with antral lysis and further small bowel bypass. S/p biopsies of the peritoneal studding which were posiitve for colon cancer.off TPN now. Incision site has healed well.has regular bowel movement at present with one this am

## 2013-02-17 NOTE — Assessment & Plan Note (Signed)
Blood pressure currently stable. Reviewed medications. Continue lisinopril, hydralazine and metoprolol. Edema is stable. Also on prn lasix. Check bmp

## 2013-02-17 NOTE — Assessment & Plan Note (Signed)
With peritoneal spread. S/p chemotherapy until December 2013. Now s/p 2 surgical procedure for SBO and here for STR. Pt is being followed by her heme-onc.

## 2013-02-17 NOTE — Assessment & Plan Note (Signed)
S/p 1 u prbc in hospital. Will check cbc

## 2013-02-25 ENCOUNTER — Ambulatory Visit: Payer: Self-pay | Admitting: Oncology

## 2013-03-13 ENCOUNTER — Non-Acute Institutional Stay (SKILLED_NURSING_FACILITY): Payer: Medicare Other | Admitting: Nurse Practitioner

## 2013-03-13 DIAGNOSIS — R5381 Other malaise: Secondary | ICD-10-CM

## 2013-03-13 DIAGNOSIS — C180 Malignant neoplasm of cecum: Secondary | ICD-10-CM

## 2013-03-13 DIAGNOSIS — R531 Weakness: Secondary | ICD-10-CM

## 2013-03-13 MED ORDER — POTASSIUM CHLORIDE CRYS ER 20 MEQ PO TBCR: 20.0000 meq | EXTENDED_RELEASE_TABLET | Freq: Two times a day (BID) | ORAL | Status: DC

## 2013-03-13 MED ORDER — FUROSEMIDE 40 MG PO TABS: 40.0000 mg | ORAL_TABLET | Freq: Every day | ORAL | Status: AC | PRN

## 2013-03-13 MED ORDER — VITAMIN D 1000 UNITS PO TABS: 1000.0000 [IU] | ORAL_TABLET | Freq: Every day | ORAL | Status: DC

## 2013-03-13 MED ORDER — PANTOPRAZOLE SODIUM 40 MG PO TBEC: 40.0000 mg | DELAYED_RELEASE_TABLET | Freq: Every day | ORAL | Status: AC

## 2013-03-13 MED ORDER — LISINOPRIL 40 MG PO TABS: 40.0000 mg | ORAL_TABLET | Freq: Every day | ORAL | Status: DC

## 2013-03-13 MED ORDER — METOPROLOL SUCCINATE ER 25 MG PO TB24: 25.0000 mg | ORAL_TABLET | Freq: Two times a day (BID) | ORAL | Status: DC

## 2013-03-13 MED ORDER — HYDRALAZINE HCL 50 MG PO TABS: 50.0000 mg | ORAL_TABLET | Freq: Two times a day (BID) | ORAL | Status: DC

## 2013-03-16 DIAGNOSIS — L8995 Pressure ulcer of unspecified site, unstageable: Secondary | ICD-10-CM

## 2013-03-16 DIAGNOSIS — C180 Malignant neoplasm of cecum: Secondary | ICD-10-CM

## 2013-03-16 DIAGNOSIS — D4959 Neoplasm of unspecified behavior of other genitourinary organ: Secondary | ICD-10-CM

## 2013-03-16 DIAGNOSIS — L89109 Pressure ulcer of unspecified part of back, unspecified stage: Secondary | ICD-10-CM

## 2013-03-24 LAB — CBC CANCER CENTER
Basophil #: 0.1 x10 3/mm (ref 0.0–0.1)
Eosinophil #: 0 x10 3/mm (ref 0.0–0.7)
Eosinophil %: 0.1 %
HCT: 31.2 % — ABNORMAL LOW (ref 35.0–47.0)
Lymphocyte #: 0.7 x10 3/mm — ABNORMAL LOW (ref 1.0–3.6)
MCH: 26.5 pg (ref 26.0–34.0)
MCV: 83 fL (ref 80–100)
Monocyte %: 8.9 %
Neutrophil #: 5.4 x10 3/mm (ref 1.4–6.5)
RBC: 3.74 10*6/uL — ABNORMAL LOW (ref 3.80–5.20)
RDW: 17.7 % — ABNORMAL HIGH (ref 11.5–14.5)

## 2013-03-24 LAB — COMPREHENSIVE METABOLIC PANEL
Albumin: 2.5 g/dL — ABNORMAL LOW (ref 3.4–5.0)
Alkaline Phosphatase: 102 U/L (ref 50–136)
BUN: 12 mg/dL (ref 7–18)
Chloride: 100 mmol/L (ref 98–107)
Creatinine: 0.72 mg/dL (ref 0.60–1.30)
Glucose: 137 mg/dL — ABNORMAL HIGH (ref 65–99)
Potassium: 4 mmol/L (ref 3.5–5.1)
Sodium: 133 mmol/L — ABNORMAL LOW (ref 136–145)
Total Protein: 6.3 g/dL — ABNORMAL LOW (ref 6.4–8.2)

## 2013-03-25 LAB — CEA: CEA: 35.1 ng/mL — ABNORMAL HIGH (ref 0.0–4.7)

## 2013-03-27 ENCOUNTER — Ambulatory Visit: Payer: Self-pay | Admitting: Oncology

## 2013-04-01 ENCOUNTER — Encounter: Payer: Self-pay | Admitting: Nurse Practitioner

## 2013-04-01 ENCOUNTER — Encounter: Payer: Self-pay | Admitting: Cardiothoracic Surgery

## 2013-04-16 ENCOUNTER — Other Ambulatory Visit: Payer: Self-pay | Admitting: *Deleted

## 2013-04-16 ENCOUNTER — Ambulatory Visit: Payer: Self-pay | Admitting: Oncology

## 2013-04-16 LAB — CBC CANCER CENTER
Basophil #: 0.1 x10 3/mm (ref 0.0–0.1)
Eosinophil #: 0 x10 3/mm (ref 0.0–0.7)
Eosinophil %: 0.2 %
HCT: 27.2 % — ABNORMAL LOW (ref 35.0–47.0)
Lymphocyte %: 12.6 %
MCH: 25.3 pg — ABNORMAL LOW (ref 26.0–34.0)
MCV: 80 fL (ref 80–100)
Monocyte #: 0.5 x10 3/mm (ref 0.2–0.9)
Monocyte %: 6.6 %
Neutrophil %: 79 %
RDW: 17.5 % — ABNORMAL HIGH (ref 11.5–14.5)
WBC: 8.3 x10 3/mm (ref 3.6–11.0)

## 2013-04-16 LAB — COMPREHENSIVE METABOLIC PANEL
Anion Gap: 9 (ref 7–16)
BUN: 12 mg/dL (ref 7–18)
Bilirubin,Total: 0.3 mg/dL (ref 0.2–1.0)
Calcium, Total: 7.9 mg/dL — ABNORMAL LOW (ref 8.5–10.1)
EGFR (Non-African Amer.): >60
Glucose: 153 mg/dL — ABNORMAL HIGH (ref 65–99)
Osmolality: 264 (ref 275–301)
SGPT (ALT): 13 U/L (ref 12–78)
Sodium: 130 mmol/L — ABNORMAL LOW (ref 136–145)
Total Protein: 6 g/dL — ABNORMAL LOW (ref 6.4–8.2)

## 2013-04-16 LAB — MAGNESIUM: Magnesium: 1.9 mg/dL

## 2013-04-25 LAB — BASIC METABOLIC PANEL
BUN: 11 mg/dL (ref 7–18)
Calcium, Total: 8.1 mg/dL — ABNORMAL LOW (ref 8.5–10.1)
Chloride: 94 mmol/L — ABNORMAL LOW (ref 98–107)
EGFR (African American): >60
EGFR (Non-African Amer.): >60
Glucose: 135 mg/dL — ABNORMAL HIGH (ref 65–99)
Osmolality: 260 (ref 275–301)
Potassium: 4.6 mmol/L (ref 3.5–5.1)
Sodium: 129 mmol/L — ABNORMAL LOW (ref 136–145)

## 2013-04-25 LAB — CBC CANCER CENTER
Basophil #: 0 x10 3/mm (ref 0.0–0.1)
Lymphocyte #: 0.9 x10 3/mm — ABNORMAL LOW (ref 1.0–3.6)
MCH: 25.4 pg — ABNORMAL LOW (ref 26.0–34.0)
MCHC: 32.7 g/dL (ref 32.0–36.0)
MCV: 78 fL — ABNORMAL LOW (ref 80–100)
Neutrophil %: 74.2 %
RBC: 3.64 10*6/uL — ABNORMAL LOW (ref 3.80–5.20)
RDW: 17.8 % — ABNORMAL HIGH (ref 11.5–14.5)

## 2013-04-27 ENCOUNTER — Encounter: Payer: Self-pay | Admitting: Nurse Practitioner

## 2013-04-27 ENCOUNTER — Ambulatory Visit: Payer: Self-pay | Admitting: Oncology

## 2013-04-29 ENCOUNTER — Encounter: Payer: Self-pay | Admitting: Cardiothoracic Surgery

## 2013-04-29 NOTE — Progress Notes (Signed)
  Subjective:    Patient ID: Jamie Fernandez, female    DOB: 1928-08-10, 79 y.o.   MRN: [id number]  HPI Comments: Pt was seen today for discharge from STR. She is here from [facility] for weakness with hx of Stage 4 adneocarcinoma of colon. She has a stable pressure ulcer to lower spine being treated with Santyl by wound care nurse here in facility.     Review of Systems  All other systems reviewed and are negative.   The following portions of the patient's history were reviewed and updated as appropriate: allergies, current medications, past family history, past medical history, past social history, past surgical history and problem list. Labs, radiographs.    Objective:   Physical Exam  Constitutional: Vital signs are normal. No distress.  Thin.  Cardiovascular: Normal rate and regular rhythm.   Pulmonary/Chest: Effort normal and breath sounds normal. No respiratory distress.  Neurological: She is alert.  Skin: Skin is warm and dry. She is not diaphoretic.  Psychiatric: She has a normal mood and affect.      Assessment & Plan:  1) Discharge home on 03/15/13 with home health pt/ot/rn. RN for wound care to spine.  2) DME: donut pillow for back, cane. 3) d/c zofran 4) Rx's electronically faxed to [facility] pharmacy in [place]. No narc's. 5) pt is to follow up with pcp in 1-2 weeks. 6) continue follow up with Cancer center as directed.

## 2013-04-30 ENCOUNTER — Ambulatory Visit: Payer: Self-pay | Admitting: Oncology

## 2013-04-30 LAB — CBC CANCER CENTER
Basophil #: 0.1 x10 3/mm (ref 0.0–0.1)
Eosinophil #: 0 x10 3/mm (ref 0.0–0.7)
HCT: 26.2 % — ABNORMAL LOW (ref 35.0–47.0)
Lymphocyte %: 22.1 %
MCV: 79 fL — ABNORMAL LOW (ref 80–100)
Monocyte %: 9.3 %
Neutrophil %: 67.1 %
RBC: 3.32 10*6/uL — ABNORMAL LOW (ref 3.80–5.20)
RDW: 18.8 % — ABNORMAL HIGH (ref 11.5–14.5)

## 2013-04-30 LAB — URINALYSIS, COMPLETE
Bilirubin,UR: NEGATIVE
Blood: NEGATIVE
Ketone: NEGATIVE
Specific Gravity: 1.015 (ref 1.003–1.030)

## 2013-04-30 LAB — COMPREHENSIVE METABOLIC PANEL
Albumin: 2.5 g/dL — ABNORMAL LOW (ref 3.4–5.0)
Anion Gap: 11 (ref 7–16)
BUN: 14 mg/dL (ref 7–18)
Co2: 24 mmol/L (ref 21–32)
Creatinine: 0.63 mg/dL (ref 0.60–1.30)
EGFR (Non-African Amer.): >60
Osmolality: 264 (ref 275–301)
Potassium: 4.2 mmol/L (ref 3.5–5.1)
Total Protein: 6 g/dL — ABNORMAL LOW (ref 6.4–8.2)

## 2013-05-01 LAB — CEA: CEA: 34.7 ng/mL — ABNORMAL HIGH (ref 0.0–4.7)

## 2013-05-14 LAB — COMPREHENSIVE METABOLIC PANEL
Anion Gap: 9 (ref 7–16)
BUN: 13 mg/dL (ref 7–18)
Bilirubin,Total: 0.3 mg/dL (ref 0.2–1.0)
Chloride: 97 mmol/L — ABNORMAL LOW (ref 98–107)
Co2: 24 mmol/L (ref 21–32)
Creatinine: 0.65 mg/dL (ref 0.60–1.30)
EGFR (Non-African Amer.): >60
Glucose: 118 mg/dL — ABNORMAL HIGH (ref 65–99)
Potassium: 4.1 mmol/L (ref 3.5–5.1)
SGPT (ALT): 14 U/L (ref 12–78)
Sodium: 130 mmol/L — ABNORMAL LOW (ref 136–145)

## 2013-05-14 LAB — CBC CANCER CENTER
Basophil #: 0 x10 3/mm (ref 0.0–0.1)
Eosinophil #: 0 x10 3/mm (ref 0.0–0.7)
HCT: 27.6 % — ABNORMAL LOW (ref 35.0–47.0)
HGB: 8.8 g/dL — ABNORMAL LOW (ref 12.0–16.0)
Lymphocyte #: 0.9 x10 3/mm — ABNORMAL LOW (ref 1.0–3.6)
MCH: 26.1 pg (ref 26.0–34.0)
MCHC: 32.1 g/dL (ref 32.0–36.0)
Monocyte #: 0.5 x10 3/mm (ref 0.2–0.9)
Monocyte %: 17.5 %
Platelet: 251 x10 3/mm (ref 150–440)
RBC: 3.38 10*6/uL — ABNORMAL LOW (ref 3.80–5.20)
WBC: 3.1 x10 3/mm — ABNORMAL LOW (ref 3.6–11.0)

## 2013-05-14 LAB — URINALYSIS, COMPLETE
Bacteria: NEGATIVE
Bilirubin,UR: NEGATIVE
Glucose,UR: NEGATIVE mg/dL (ref 0–75)
Leukocyte Esterase: NEGATIVE
Nitrite: NEGATIVE
Ph: 6 (ref 4.5–8.0)

## 2013-05-15 LAB — CEA: CEA: 25.8 ng/mL — ABNORMAL HIGH (ref 0.0–4.7)

## 2013-05-27 ENCOUNTER — Ambulatory Visit: Payer: Self-pay | Admitting: Oncology

## 2013-05-27 ENCOUNTER — Encounter: Payer: Self-pay | Admitting: Nurse Practitioner

## 2013-05-28 ENCOUNTER — Ambulatory Visit: Payer: Self-pay | Admitting: Oncology

## 2013-05-28 LAB — COMPREHENSIVE METABOLIC PANEL
Alkaline Phosphatase: 99 U/L (ref 50–136)
Anion Gap: 10 (ref 7–16)
Bilirubin,Total: 0.2 mg/dL (ref 0.2–1.0)
Chloride: 99 mmol/L (ref 98–107)
EGFR (African American): >60
EGFR (Non-African Amer.): >60
Glucose: 124 mg/dL — ABNORMAL HIGH (ref 65–99)
Osmolality: 263 (ref 275–301)
SGOT(AST): 16 U/L (ref 15–37)
Sodium: 130 mmol/L — ABNORMAL LOW (ref 136–145)
Total Protein: 5.3 g/dL — ABNORMAL LOW (ref 6.4–8.2)

## 2013-05-28 LAB — CBC CANCER CENTER
Basophil %: 0.9 %
Eosinophil #: 0 x10 3/mm (ref 0.0–0.7)
Eosinophil %: 0.3 %
HCT: 24.3 % — ABNORMAL LOW (ref 35.0–47.0)
HGB: 8.2 g/dL — ABNORMAL LOW (ref 12.0–16.0)
Lymphocyte #: 0.9 x10 3/mm — ABNORMAL LOW (ref 1.0–3.6)
Lymphocyte %: 43.1 %
MCH: 28.1 pg (ref 26.0–34.0)
MCV: 84 fL (ref 80–100)
Monocyte #: 0.5 x10 3/mm (ref 0.2–0.9)
Monocyte %: 25.7 %
Neutrophil #: 0.6 x10 3/mm — ABNORMAL LOW (ref 1.4–6.5)
Neutrophil %: 30 %
RBC: 2.91 10*6/uL — ABNORMAL LOW (ref 3.80–5.20)
RDW: 23.1 % — ABNORMAL HIGH (ref 11.5–14.5)

## 2013-05-29 LAB — CEA: CEA: 24.5 ng/mL — ABNORMAL HIGH (ref 0.0–4.7)

## 2013-06-03 ENCOUNTER — Encounter: Payer: Self-pay | Admitting: Cardiothoracic Surgery

## 2013-06-04 LAB — COMPREHENSIVE METABOLIC PANEL
Albumin: 2.3 g/dL — ABNORMAL LOW (ref 3.4–5.0)
Anion Gap: 9 (ref 7–16)
Bilirubin,Total: 0.2 mg/dL (ref 0.2–1.0)
Co2: 25 mmol/L (ref 21–32)
EGFR (Non-African Amer.): >60
Glucose: 124 mg/dL — ABNORMAL HIGH (ref 65–99)
Osmolality: 269 (ref 275–301)
Potassium: 4 mmol/L (ref 3.5–5.1)
SGOT(AST): 17 U/L (ref 15–37)
SGPT (ALT): 14 U/L (ref 12–78)
Total Protein: 5.7 g/dL — ABNORMAL LOW (ref 6.4–8.2)

## 2013-06-04 LAB — URINALYSIS, COMPLETE
Bilirubin,UR: NEGATIVE
Ketone: NEGATIVE
Leukocyte Esterase: NEGATIVE
Nitrite: NEGATIVE
Protein: NEGATIVE
Specific Gravity: 1.02 (ref 1.003–1.030)

## 2013-06-04 LAB — CBC CANCER CENTER
Basophil #: 0.1 x10 3/mm (ref 0.0–0.1)
Basophil %: 1.1 %
Eosinophil #: 0 x10 3/mm (ref 0.0–0.7)
Eosinophil %: 0.2 %
HCT: 28 % — ABNORMAL LOW (ref 35.0–47.0)
Lymphocyte %: 21.9 %
MCH: 28.4 pg (ref 26.0–34.0)
MCHC: 33.1 g/dL (ref 32.0–36.0)
MCV: 86 fL (ref 80–100)
Monocyte #: 0.5 x10 3/mm (ref 0.2–0.9)
Neutrophil %: 67.8 %
Platelet: 425 x10 3/mm (ref 150–440)
RBC: 3.27 10*6/uL — ABNORMAL LOW (ref 3.80–5.20)
RDW: 23.3 % — ABNORMAL HIGH (ref 11.5–14.5)

## 2013-06-07 ENCOUNTER — Inpatient Hospital Stay: Payer: Self-pay | Admitting: General Practice

## 2013-06-07 ENCOUNTER — Ambulatory Visit: Payer: Self-pay | Admitting: Orthopedic Surgery

## 2013-06-07 LAB — COMPREHENSIVE METABOLIC PANEL
Albumin: 2.3 g/dL — ABNORMAL LOW (ref 3.4–5.0)
Alkaline Phosphatase: 114 U/L (ref 50–136)
Anion Gap: 9 (ref 7–16)
Bilirubin,Total: 0.3 mg/dL (ref 0.2–1.0)
Calcium, Total: 7.8 mg/dL — ABNORMAL LOW (ref 8.5–10.1)
Co2: 29 mmol/L (ref 21–32)
Creatinine: 0.86 mg/dL (ref 0.60–1.30)
EGFR (African American): >60
EGFR (Non-African Amer.): >60
Glucose: 86 mg/dL (ref 65–99)
Osmolality: 264 (ref 275–301)
Potassium: 3.4 mmol/L — ABNORMAL LOW (ref 3.5–5.1)
SGOT(AST): 19 U/L (ref 15–37)
SGPT (ALT): 14 U/L (ref 12–78)
Sodium: 131 mmol/L — ABNORMAL LOW (ref 136–145)
Total Protein: 5.4 g/dL — ABNORMAL LOW (ref 6.4–8.2)

## 2013-06-07 LAB — CBC
MCH: 28.1 pg (ref 26.0–34.0)
Platelet: 322 10*3/uL (ref 150–440)
RBC: 3.02 10*6/uL — ABNORMAL LOW (ref 3.80–5.20)
RDW: 23.3 % — ABNORMAL HIGH (ref 11.5–14.5)

## 2013-06-07 LAB — URINALYSIS, COMPLETE
Bilirubin,UR: NEGATIVE
Hyaline Cast: 3
Ketone: NEGATIVE
Leukocyte Esterase: NEGATIVE
Ph: 5 (ref 4.5–8.0)
Squamous Epithelial: <1

## 2013-06-07 LAB — PROTIME-INR
INR: 0.9
Prothrombin Time: 12.5 secs (ref 11.5–14.7)

## 2013-06-09 LAB — BASIC METABOLIC PANEL
Anion Gap: 7 (ref 7–16)
BUN: 11 mg/dL (ref 7–18)
Co2: 28 mmol/L (ref 21–32)
EGFR (Non-African Amer.): >60
Glucose: 114 mg/dL — ABNORMAL HIGH (ref 65–99)
Potassium: 3.4 mmol/L — ABNORMAL LOW (ref 3.5–5.1)
Sodium: 129 mmol/L — ABNORMAL LOW (ref 136–145)

## 2013-06-09 LAB — HEMOGLOBIN: HGB: 8 g/dL — ABNORMAL LOW (ref 12.0–16.0)

## 2013-06-09 LAB — PLATELET COUNT: Platelet: 212 10*3/uL (ref 150–440)

## 2013-06-10 LAB — BASIC METABOLIC PANEL
Anion Gap: 2 — ABNORMAL LOW (ref 7–16)
BUN: 12 mg/dL (ref 7–18)
Chloride: 94 mmol/L — ABNORMAL LOW (ref 98–107)
EGFR (African American): >60
Potassium: 3.3 mmol/L — ABNORMAL LOW (ref 3.5–5.1)
Sodium: 129 mmol/L — ABNORMAL LOW (ref 136–145)

## 2013-06-10 LAB — CBC WITH DIFFERENTIAL/PLATELET
Basophil %: 0.2 %
Eosinophil #: 0 10*3/uL (ref 0.0–0.7)
Lymphocyte #: 1.2 10*3/uL (ref 1.0–3.6)
Monocyte %: 1.2 %
Neutrophil #: 14.9 10*3/uL — ABNORMAL HIGH (ref 1.4–6.5)
Neutrophil %: 90.9 %
RBC: 2.82 10*6/uL — ABNORMAL LOW (ref 3.80–5.20)
RDW: 25.5 % — ABNORMAL HIGH (ref 11.5–14.5)

## 2013-06-10 LAB — PATHOLOGY REPORT

## 2013-06-11 LAB — BASIC METABOLIC PANEL
Anion Gap: 4 — ABNORMAL LOW (ref 7–16)
BUN: 10 mg/dL (ref 7–18)
Calcium, Total: 7.3 mg/dL — ABNORMAL LOW (ref 8.5–10.1)
Chloride: 99 mmol/L (ref 98–107)
Co2: 28 mmol/L (ref 21–32)
Creatinine: 0.6 mg/dL (ref 0.60–1.30)
Glucose: 74 mg/dL (ref 65–99)
Osmolality: 260 (ref 275–301)
Potassium: 4.5 mmol/L (ref 3.5–5.1)

## 2013-06-11 LAB — PLATELET COUNT: Platelet: 157 10*3/uL (ref 150–440)

## 2013-06-21 LAB — BASIC METABOLIC PANEL
Anion Gap: 4 — ABNORMAL LOW (ref 7–16)
BUN: 11 mg/dL (ref 7–18)
Calcium, Total: 7.5 mg/dL — ABNORMAL LOW (ref 8.5–10.1)
Chloride: 104 mmol/L (ref 98–107)
Co2: 28 mmol/L (ref 21–32)
Creatinine: 0.63 mg/dL (ref 0.60–1.30)
EGFR (African American): >60
Osmolality: 272 (ref 275–301)
Potassium: 3.5 mmol/L (ref 3.5–5.1)

## 2013-06-21 LAB — URINALYSIS, COMPLETE
Bacteria: NONE SEEN
Ketone: NEGATIVE
Leukocyte Esterase: NEGATIVE
Nitrite: NEGATIVE
Ph: 5 (ref 4.5–8.0)
RBC,UR: NONE SEEN /HPF (ref 0–5)

## 2013-06-21 LAB — CBC
MCH: 28.5 pg (ref 26.0–34.0)
Platelet: 466 10*3/uL — ABNORMAL HIGH (ref 150–440)

## 2013-06-22 ENCOUNTER — Inpatient Hospital Stay: Payer: Self-pay | Admitting: Internal Medicine

## 2013-06-22 LAB — CK TOTAL AND CKMB (NOT AT ARMC)
CK, Total: 39 U/L
CK, Total: 25 U/L
CK, Total: 25 U/L (ref 21–215)
CK-MB: <0.5 ng/mL — ABNORMAL LOW
CK-MB: <0.5 ng/mL — ABNORMAL LOW

## 2013-06-22 LAB — TROPONIN I
Troponin-I: 0.04 ng/mL
Troponin-I: 0.03 ng/mL

## 2013-06-23 LAB — CBC WITH DIFFERENTIAL/PLATELET
Eosinophil #: 0.1 10*3/uL (ref 0.0–0.7)
Eosinophil %: 0.4 %
HGB: 8.5 g/dL — ABNORMAL LOW (ref 12.0–16.0)
Lymphocyte #: 1.1 10*3/uL (ref 1.0–3.6)
Lymphocyte %: 7 %
MCV: 85 fL (ref 80–100)
Monocyte #: 0.7 x10 3/mm (ref 0.2–0.9)
Monocyte %: 4.3 %
Neutrophil #: 14.2 10*3/uL — ABNORMAL HIGH (ref 1.4–6.5)
Neutrophil %: 87.8 %
Platelet: 537 10*3/uL — ABNORMAL HIGH (ref 150–440)
RBC: 2.96 10*6/uL — ABNORMAL LOW (ref 3.80–5.20)
RDW: 23.9 % — ABNORMAL HIGH (ref 11.5–14.5)
WBC: 16.1 10*3/uL — ABNORMAL HIGH (ref 3.6–11.0)

## 2013-06-23 LAB — URINE CULTURE

## 2013-06-23 LAB — COMPREHENSIVE METABOLIC PANEL
Alkaline Phosphatase: 149 U/L — ABNORMAL HIGH (ref 50–136)
Anion Gap: 4 — ABNORMAL LOW (ref 7–16)
BUN: 14 mg/dL (ref 7–18)
Calcium, Total: 7.6 mg/dL — ABNORMAL LOW (ref 8.5–10.1)
Co2: 30 mmol/L (ref 21–32)
Glucose: 83 mg/dL (ref 65–99)
Osmolality: 272 (ref 275–301)
Potassium: 4.1 mmol/L (ref 3.5–5.1)
Sodium: 136 mmol/L (ref 136–145)

## 2013-06-23 LAB — LIPID PANEL
Cholesterol: 97 mg/dL (ref 0–200)
Triglycerides: 62 mg/dL (ref 0–200)
VLDL Cholesterol, Calc: 12 mg/dL (ref 5–40)

## 2013-06-27 ENCOUNTER — Ambulatory Visit: Payer: Self-pay | Admitting: Oncology

## 2013-07-11 ENCOUNTER — Encounter: Payer: Self-pay | Admitting: Cardiothoracic Surgery

## 2013-07-15 ENCOUNTER — Ambulatory Visit (INDEPENDENT_AMBULATORY_CARE_PROVIDER_SITE_OTHER): Payer: Medicare Other | Admitting: Cardiovascular Disease

## 2013-07-15 ENCOUNTER — Encounter: Payer: Self-pay | Admitting: Cardiovascular Disease

## 2013-07-15 VITALS — BP 92/50 | HR 117 | Ht 60.0 in | Wt 93.0 lb

## 2013-07-15 DIAGNOSIS — R609 Edema, unspecified: Secondary | ICD-10-CM

## 2013-07-15 DIAGNOSIS — C180 Malignant neoplasm of cecum: Secondary | ICD-10-CM

## 2013-07-15 DIAGNOSIS — I10 Essential (primary) hypertension: Secondary | ICD-10-CM

## 2013-07-15 DIAGNOSIS — R600 Localized edema: Secondary | ICD-10-CM

## 2013-07-15 DIAGNOSIS — R0602 Shortness of breath: Secondary | ICD-10-CM

## 2013-07-15 DIAGNOSIS — D630 Anemia in neoplastic disease: Secondary | ICD-10-CM

## 2013-07-15 DIAGNOSIS — I4891 Unspecified atrial fibrillation: Secondary | ICD-10-CM

## 2013-07-15 NOTE — Assessment & Plan Note (Signed)
Colon cancer, managed by Dr. Orlie Dakin

## 2013-07-15 NOTE — Progress Notes (Signed)
Patient ID: Jamie Fernandez, female    DOB: October 16, 1928, 79 y.o.   MRN: [id number]  HPI Comments: Jamie Fernandez is a 79 year old woman with history of colon cancer, status post surgery April 2012 the admission in February 2014 for bowel obstruction, found to have atrial fibrillation with heart rate 180 beats per minute, improved with beta blockers,  and restoration of normal sinus rhythm, repeat valve surgery for correction 01/18/2013,  Fall and hip fracture in July 2014, status post surgical repair with diagnosis at that time of the severe nutrition, anemia. She presents to establish care in the office.  Echocardiogram February 21st  2014 showing normal LV systolic function, normal RV function, right heart pressures 40-50 mm mercury In the setting of acute atrial fibrillation on February 20   She presents with family today. He reports that her weight has significantly improved from February 2014. She is ambulating with a walker. Overall her spirits are high. She denies any significant shortness of breath. She does have significant pitting lower extremity edema. The family is concerned as they were told that she had CHF. She's been taking Lasix 40 mg in the morning. By the end of the day, legs are tight, no significant edema in the morning per the patient. She's unable to tolerate compression hose as they caused significant irritation to her skin. No significant cough, no orthopnea or PND.  EKG shows normal sinus rhythm with rate 112 beats per minute, rare PVC, nonspecific ST and in the anterolateral leads   Outpatient Encounter Prescriptions as of 07/15/2013  Medication Sig Dispense Refill  . acetaminophen (TYLENOL) 325 MG tablet Take 650 mg by mouth every 4 (four) hours as needed for pain.      . furosemide (LASIX) 40 MG tablet Take 1 tablet (40 mg total) by mouth daily as needed (swelling/ edema).  30 tablet  0  . hydrALAZINE (APRESOLINE) 50 MG tablet Take 1 tablet (50 mg total) by mouth 2 (two) times  daily with a meal.  60 tablet  0  . HYDROcodone-acetaminophen (NORCO/VICODIN) 5-325 MG per tablet every 6 (six) hours as needed.       Marland Kitchen lisinopril (PRINIVIL,ZESTRIL) 40 MG tablet Take 1 tablet (40 mg total) by mouth daily.  30 tablet  0  . metoprolol tartrate (LOPRESSOR) 25 MG tablet Take 12.5 mg by mouth 2 (two) times daily.      . pantoprazole (PROTONIX) 40 MG tablet Take 1 tablet (40 mg total) by mouth daily.  30 tablet  0  . potassium chloride SA (K-DUR,KLOR-CON) 20 MEQ tablet Take 1 tablet (20 mEq total) by mouth 2 (two) times daily.  60 tablet  0  . pravastatin (PRAVACHOL) 20 MG tablet Take 20 mg by mouth daily.       Review of Systems  Constitutional: Positive for fatigue.  HENT: Negative.   Eyes: Negative.   Respiratory: Negative.   Cardiovascular: Positive for leg swelling.  Gastrointestinal: Negative.   Musculoskeletal: Positive for gait problem.  Skin: Negative.   Neurological: Negative.   Psychiatric/Behavioral: Negative.   All other systems reviewed and are negative.     BP 92/50  Pulse 117  Ht 5' (1.524 m)  Wt 93 lb (42.185 kg)  BMI 18.16 kg/m2  SpO2 96% Repeat blood pressure 104 systolic  Physical Exam  Nursing note and vitals reviewed. Constitutional: She is oriented to person, place, and time.  Very thin woman  HENT:  Head: Normocephalic.  Nose: Nose normal.  Mouth/Throat: Oropharynx is clear and  moist.  Eyes: Conjunctivae are normal. Pupils are equal, round, and reactive to light.  Neck: Normal range of motion. Neck supple. No JVD present.  Cardiovascular: Normal rate, regular rhythm, S1 normal, S2 normal, normal heart sounds and intact distal pulses.  Exam reveals no gallop and no friction rub.   No murmur heard. 2+  pitting edema lower extremities to the knees  Pulmonary/Chest: Effort normal and breath sounds normal. No respiratory distress. She has no wheezes. She has no rales. She exhibits no tenderness.  Abdominal: Soft. Bowel sounds are normal.  She exhibits no distension. There is no tenderness.  Musculoskeletal: Normal range of motion. She exhibits no edema and no tenderness.  Lymphadenopathy:    She has no cervical adenopathy.  Neurological: She is alert and oriented to person, place, and time. Coordination normal.  Skin: Skin is warm and dry. No rash noted. No erythema.  Psychiatric: She has a normal mood and affect. Her behavior is normal. Judgment and thought content normal.    Assessment and Plan

## 2013-07-15 NOTE — Patient Instructions (Addendum)
  See if Dr. Christian Mate will check a BMP (to look at renal function and potassium) Ask him about iron  Hold the hydralazine Hold the lisinopril Start metoprolol succinate to 25 mg twice a day  Take an extra lasix/furosemide with potassium in the afternoon as needed for worsening leg swelling or shortness of breath or weight gain of 3 pounds  Monitor your blood pressure, call the office if too high or  low  Please call us if you have new issues that need to be addressed before your next appt.  Your physician wants you to follow-up in: 1 months.

## 2013-07-15 NOTE — Assessment & Plan Note (Signed)
Blood pressure is low. She is asymptomatic. We'll hold her lisinopril and hydralazine, change her metoprolol tartrate back to metoprolol succinate 25 mg twice a day for better heart rate control. He suggested she monitor her blood pressure at home and call our office if he continues to run low, or high.

## 2013-07-15 NOTE — Assessment & Plan Note (Signed)
She does appear pale on today's visit. This is likely contributing to her edema.  Uncertain if she needs iron infusion again. Suggested she talk with oncology.

## 2013-07-15 NOTE — Assessment & Plan Note (Addendum)
Significant leg edema bilaterally is likely multifactorial including very low protein level/malnutrition, anemia, diastolic CHF. We have suggested she stay on Lasix 40 mg every morning, take extra Lasix in the afternoon for any weight gain more than 3 pounds. Suggested she use Ace wraps if she does not tolerate TED hose.  Recommended she have a basic metabolic panel when she sees Dr. Orlie Dakin tomorrow

## 2013-07-16 ENCOUNTER — Ambulatory Visit: Payer: Self-pay | Admitting: Oncology

## 2013-07-16 ENCOUNTER — Other Ambulatory Visit: Payer: Self-pay | Admitting: *Deleted

## 2013-07-16 LAB — CBC CANCER CENTER
Basophil #: 0.1 x10 3/mm (ref 0.0–0.1)
Basophil %: 0.8 %
Eosinophil #: 0 x10 3/mm (ref 0.0–0.7)
Eosinophil %: 0.2 %
HCT: 22.1 % — ABNORMAL LOW (ref 35.0–47.0)
Lymphocyte %: 11 %
MCHC: 31.5 g/dL — ABNORMAL LOW (ref 32.0–36.0)
MCV: 92 fL (ref 80–100)
Monocyte #: 0.9 x10 3/mm (ref 0.2–0.9)
Neutrophil #: 7.7 x10 3/mm — ABNORMAL HIGH (ref 1.4–6.5)
Neutrophil %: 79.1 %
RBC: 2.42 10*6/uL — ABNORMAL LOW (ref 3.80–5.20)
RDW: 21.9 % — ABNORMAL HIGH (ref 11.5–14.5)
WBC: 9.8 x10 3/mm (ref 3.6–11.0)

## 2013-07-16 LAB — IRON AND TIBC
Iron Bind.Cap.(Total): 135 ug/dL — ABNORMAL LOW (ref 250–450)
Iron: 28 ug/dL — ABNORMAL LOW (ref 50–170)

## 2013-07-16 LAB — COMPREHENSIVE METABOLIC PANEL
Albumin: 1.5 g/dL — ABNORMAL LOW (ref 3.4–5.0)
Bilirubin,Total: 0.4 mg/dL (ref 0.2–1.0)
Calcium, Total: 7.8 mg/dL — ABNORMAL LOW (ref 8.5–10.1)
Co2: 23 mmol/L (ref 21–32)
EGFR (African American): >60
Glucose: 80 mg/dL (ref 65–99)
Osmolality: 268 (ref 275–301)
Sodium: 134 mmol/L — ABNORMAL LOW (ref 136–145)
Total Protein: 5.5 g/dL — ABNORMAL LOW (ref 6.4–8.2)

## 2013-07-16 MED ORDER — PRAVASTATIN SODIUM 20 MG PO TABS: 20.0000 mg | ORAL_TABLET | Freq: Every day | ORAL | Status: AC

## 2013-07-16 MED ORDER — METOPROLOL TARTRATE 25 MG PO TABS: 12.5000 mg | ORAL_TABLET | Freq: Two times a day (BID) | ORAL | Status: DC

## 2013-07-16 MED ORDER — POTASSIUM CHLORIDE CRYS ER 20 MEQ PO TBCR: 20.0000 meq | EXTENDED_RELEASE_TABLET | Freq: Two times a day (BID) | ORAL | Status: AC

## 2013-07-17 LAB — CEA: CEA: 32.2 ng/mL — ABNORMAL HIGH (ref 0.0–4.7)

## 2013-07-21 ENCOUNTER — Other Ambulatory Visit: Payer: Self-pay

## 2013-07-21 MED ORDER — METOPROLOL SUCCINATE ER 25 MG PO TB24: 25.0000 mg | ORAL_TABLET | Freq: Two times a day (BID) | ORAL | Status: DC

## 2013-07-21 MED ORDER — METOPROLOL SUCCINATE ER 25 MG PO TB24: 25.0000 mg | ORAL_TABLET | Freq: Two times a day (BID) | ORAL | Status: AC

## 2013-07-21 NOTE — Telephone Encounter (Signed)
Refill sent back in for  60 tablets.

## 2013-07-21 NOTE — Telephone Encounter (Signed)
Refill sent for metoprolol succ 25 mg take one tablet bid per Dr. Mariah Milling last office visit.

## 2013-07-28 ENCOUNTER — Ambulatory Visit: Payer: Self-pay | Admitting: Oncology

## 2013-07-28 ENCOUNTER — Inpatient Hospital Stay: Payer: Self-pay | Admitting: Internal Medicine

## 2013-07-28 LAB — URINALYSIS, COMPLETE
Bilirubin,UR: NEGATIVE
Blood: NEGATIVE
Glucose,UR: NEGATIVE mg/dL (ref 0–75)
Leukocyte Esterase: NEGATIVE
Ph: 5 (ref 4.5–8.0)
RBC,UR: <1 /HPF (ref 0–5)
Specific Gravity: 1.02 (ref 1.003–1.030)

## 2013-07-28 LAB — BASIC METABOLIC PANEL
Anion Gap: 6 — ABNORMAL LOW (ref 7–16)
BUN: 12 mg/dL (ref 7–18)
Calcium, Total: 7.8 mg/dL — ABNORMAL LOW (ref 8.5–10.1)
Chloride: 101 mmol/L (ref 98–107)
EGFR (African American): >60
EGFR (Non-African Amer.): >60
Glucose: 98 mg/dL (ref 65–99)
Osmolality: 264 (ref 275–301)
Potassium: 4 mmol/L (ref 3.5–5.1)
Sodium: 132 mmol/L — ABNORMAL LOW (ref 136–145)

## 2013-07-28 LAB — CBC
HCT: 29.3 % — ABNORMAL LOW (ref 35.0–47.0)
HGB: 9.6 g/dL — ABNORMAL LOW (ref 12.0–16.0)
MCH: 30 pg (ref 26.0–34.0)
MCHC: 32.6 g/dL (ref 32.0–36.0)
MCV: 92 fL (ref 80–100)
RBC: 3.19 10*6/uL — ABNORMAL LOW (ref 3.80–5.20)
RDW: 17.2 % — ABNORMAL HIGH (ref 11.5–14.5)

## 2013-07-30 LAB — BASIC METABOLIC PANEL
Anion Gap: 7 (ref 7–16)
Calcium, Total: 7.5 mg/dL — ABNORMAL LOW (ref 8.5–10.1)
Chloride: 104 mmol/L (ref 98–107)
Co2: 24 mmol/L (ref 21–32)
Creatinine: 0.58 mg/dL — ABNORMAL LOW (ref 0.60–1.30)
Osmolality: 267 (ref 275–301)

## 2013-07-30 LAB — CBC WITH DIFFERENTIAL/PLATELET
Eosinophil #: 0 10*3/uL (ref 0.0–0.7)
HCT: 26.2 % — ABNORMAL LOW (ref 35.0–47.0)
Lymphocyte #: 1.1 10*3/uL (ref 1.0–3.6)
Lymphocyte %: 12 %
MCV: 94 fL (ref 80–100)
Monocyte #: 0.6 x10 3/mm (ref 0.2–0.9)
Neutrophil #: 7.1 10*3/uL — ABNORMAL HIGH (ref 1.4–6.5)
RBC: 2.8 10*6/uL — ABNORMAL LOW (ref 3.80–5.20)
RDW: 16.9 % — ABNORMAL HIGH (ref 11.5–14.5)

## 2013-07-30 LAB — SEDIMENTATION RATE: Erythrocyte Sed Rate: 59 mm/hr — ABNORMAL HIGH (ref 0–30)

## 2013-08-02 LAB — CULTURE, BLOOD (SINGLE)

## 2013-08-08 ENCOUNTER — Encounter: Payer: Self-pay | Admitting: Cardiothoracic Surgery

## 2013-08-15 ENCOUNTER — Ambulatory Visit (INDEPENDENT_AMBULATORY_CARE_PROVIDER_SITE_OTHER): Payer: Medicare Other | Admitting: Cardiovascular Disease

## 2013-08-15 ENCOUNTER — Encounter: Payer: Self-pay | Admitting: Cardiovascular Disease

## 2013-08-15 VITALS — BP 104/64 | HR 86 | Temp 98.1°F | Ht 60.0 in | Wt 95.0 lb

## 2013-08-15 DIAGNOSIS — R0602 Shortness of breath: Secondary | ICD-10-CM

## 2013-08-15 DIAGNOSIS — I10 Essential (primary) hypertension: Secondary | ICD-10-CM

## 2013-08-15 DIAGNOSIS — D630 Anemia in neoplastic disease: Secondary | ICD-10-CM

## 2013-08-15 DIAGNOSIS — R509 Fever, unspecified: Secondary | ICD-10-CM

## 2013-08-15 DIAGNOSIS — R609 Edema, unspecified: Secondary | ICD-10-CM

## 2013-08-15 DIAGNOSIS — R600 Localized edema: Secondary | ICD-10-CM

## 2013-08-15 DIAGNOSIS — R000 Tachycardia, unspecified: Secondary | ICD-10-CM

## 2013-08-15 NOTE — Assessment & Plan Note (Addendum)
Leg edema likely from diastolic dysfunction, exacerbated by anemia, poor nutrition. Encouraged her to continue on Lasix daily, extra Lasix in the afternoon for worsening edema or shortness of breath. Edema would likely improve with higher blood count and better nutrition.

## 2013-08-15 NOTE — Progress Notes (Signed)
Patient ID: Jamie Fernandez, female    DOB: 03-Mar-1928, 79 y.o.   MRN: [id number]  HPI Comments: Jamie Fernandez is a 79 year old woman with history of colon cancer, status post surgery April 2012 the admission in February 2014 for bowel obstruction, found to have atrial fibrillation with heart rate 180 beats per minute, improved with beta blockers,  and restoration of normal sinus rhythm, repeat bowel surgery for correction 01/18/2013,  Fall and hip fracture in July 2014, status post surgical repair with diagnosis at that time of the severe malnutrition, anemia. She presents for routine followup.  She reports admission to the hospital at the beginning of September from September 1 until September 3 for left hip wound infection, MRSA. She was treated with IV antibiotics and discharged on Bactrim. Bactrim finished approximately 6 days ago. 4 days ago she started having fevers, sweats. Daughter presents with her today and reports that after Tylenol, she had soaking sweats as her fever breaks. She has significant weakness, unable to transfer from bed to chair. Appetite is poor. She continues to lose weight. Continues to have pitting lower extremity edema. Daughter reports that she received blood last month for hematocrit of 22. Most recent hematocrit on September 3 was 26.  Several medication changes on her last clinic visit. ACE inhibitor and hydralazine held for low blood pressure. The Toprol was increased for tachycardia. Overall blood pressure and heart rate has been improved.  Echocardiogram February 21st  2014 showing normal LV systolic function, normal RV function, right heart pressures 40-50 mm mercury In the setting of acute atrial fibrillation on February 20  EKG shows normal sinus rhythm with rate 86 beats per minute,nonspecific ST and in the anterolateral leads   Outpatient Encounter Prescriptions as of 07/15/2013  Medication Sig Dispense Refill  . acetaminophen (TYLENOL) 325 MG tablet Take 650 mg  by mouth every 4 (four) hours as needed for pain.      . furosemide (LASIX) 40 MG tablet Take 1 tablet (40 mg total) by mouth daily as needed (swelling/ edema).  30 tablet  0  . hydrALAZINE (APRESOLINE) 50 MG tablet Take 1 tablet (50 mg total) by mouth 2 (two) times daily with a meal.  60 tablet  0  . HYDROcodone-acetaminophen (NORCO/VICODIN) 5-325 MG per tablet every 6 (six) hours as needed.       Marland Kitchen lisinopril (PRINIVIL,ZESTRIL) 40 MG tablet Take 1 tablet (40 mg total) by mouth daily.  30 tablet  0  . metoprolol tartrate (LOPRESSOR) 25 MG tablet Take 12.5 mg by mouth 2 (two) times daily.      . pantoprazole (PROTONIX) 40 MG tablet Take 1 tablet (40 mg total) by mouth daily.  30 tablet  0  . potassium chloride SA (K-DUR,KLOR-CON) 20 MEQ tablet Take 1 tablet (20 mEq total) by mouth 2 (two) times daily.  60 tablet  0  . pravastatin (PRAVACHOL) 20 MG tablet Take 20 mg by mouth daily.       Review of Systems  Constitutional: Positive for fatigue.  HENT: Negative.   Eyes: Negative.   Respiratory: Negative.   Cardiovascular: Positive for leg swelling.  Gastrointestinal: Negative.   Musculoskeletal: Positive for gait problem.  Skin: Negative.   Neurological: Positive for weakness.  Psychiatric/Behavioral: Negative.   All other systems reviewed and are negative.     BP 104/64  Pulse 86  Temp(Src) 98.1 F (36.7 C)  Ht 5' (1.524 m)  Wt 95 lb (43.092 kg)  BMI 18.55 kg/m2  SpO2 96%  Physical Exam  Nursing note and vitals reviewed. Constitutional: She is oriented to person, place, and time.  Very thin woman, pale  HENT:  Head: Normocephalic.  Nose: Nose normal.  Mouth/Throat: Oropharynx is clear and moist.  Eyes: Conjunctivae are normal. Pupils are equal, round, and reactive to light.  Neck: Normal range of motion. Neck supple. No JVD present.  Cardiovascular: Normal rate, regular rhythm, S1 normal, S2 normal, normal heart sounds and intact distal pulses.  Exam reveals no gallop and  no friction rub.   No murmur heard. 2+  pitting edema lower extremities to the knees  Pulmonary/Chest: Effort normal and breath sounds normal. No respiratory distress. She has no wheezes. She has no rales. She exhibits no tenderness.  Abdominal: Soft. Bowel sounds are normal. She exhibits no distension. There is no tenderness.  Musculoskeletal: Normal range of motion. She exhibits no edema and no tenderness.  Lymphadenopathy:    She has no cervical adenopathy.  Neurological: She is alert and oriented to person, place, and time. Coordination normal.  Skin: Skin is warm and dry. No rash noted. No erythema.  Psychiatric: She has a normal mood and affect. Her behavior is normal. Judgment and thought content normal.    Assessment and Plan

## 2013-08-15 NOTE — Assessment & Plan Note (Signed)
Blood pressure improved by holding ACE inhibitor and hydralazine.  tolerating beta blocker

## 2013-08-15 NOTE — Assessment & Plan Note (Signed)
Since nitroglycerin was completed 6 days ago, she has  started to have fevers, sweats. This is concerning for recurrent infection. We tried to call Dr. Milinda Cave office. He and the nurse were not available. Message was left with her office . Suspect she will need further workup, possible continued antibiotics .

## 2013-08-15 NOTE — Patient Instructions (Addendum)
You are doing well. No medication changes were made.  Please continue lasix daily, take extra dose after lunch for worsening leg edema or shortness of breath  Please call us if you have new issues that need to be addressed before your next appt.  Your physician wants you to follow-up in: 3 months.  You will receive a reminder letter in the mail two months in advance. If you don't receive a letter, please call our office to schedule the follow-up appointment.

## 2013-08-15 NOTE — Assessment & Plan Note (Signed)
Her anemia is likely playing a major role in her weakness, edema , pleural effusion. She may feel better with a hematocrit in the high 20s, low 30s. Suspect she will need blood transfusion periodically. Uncertain if she needs iron infusion

## 2013-08-16 ENCOUNTER — Inpatient Hospital Stay: Payer: Self-pay | Admitting: Internal Medicine

## 2013-08-16 LAB — COMPREHENSIVE METABOLIC PANEL
Alkaline Phosphatase: 174 U/L — ABNORMAL HIGH (ref 50–136)
BUN: 18 mg/dL (ref 7–18)
Bilirubin,Total: 0.5 mg/dL (ref 0.2–1.0)
Chloride: 101 mmol/L (ref 98–107)
Co2: 24 mmol/L (ref 21–32)
Creatinine: 0.66 mg/dL (ref 0.60–1.30)
EGFR (African American): >60
EGFR (Non-African Amer.): >60
Osmolality: 266 (ref 275–301)
SGOT(AST): 26 U/L (ref 15–37)
SGPT (ALT): 6 U/L — ABNORMAL LOW (ref 12–78)
Sodium: 132 mmol/L — ABNORMAL LOW (ref 136–145)

## 2013-08-16 LAB — DIFFERENTIAL
Basophil #: 0 10*3/uL (ref 0.0–0.1)
Eosinophil #: 0 10*3/uL (ref 0.0–0.7)
Lymphocyte #: 0.9 10*3/uL — ABNORMAL LOW (ref 1.0–3.6)
Lymphocyte %: 6.2 %
Monocyte #: 0.6 x10 3/mm (ref 0.2–0.9)
Monocyte %: 4.3 %
Neutrophil %: 89.3 %

## 2013-08-16 LAB — CBC
HGB: 8.4 g/dL — ABNORMAL LOW (ref 12.0–16.0)
MCH: 29.1 pg (ref 26.0–34.0)
MCHC: 32 g/dL (ref 32.0–36.0)
MCV: 91 fL (ref 80–100)
RBC: 2.88 10*6/uL — ABNORMAL LOW (ref 3.80–5.20)
RDW: 20 % — ABNORMAL HIGH (ref 11.5–14.5)
WBC: 14.1 10*3/uL — ABNORMAL HIGH (ref 3.6–11.0)

## 2013-08-16 LAB — URINALYSIS, COMPLETE
Bacteria: NONE SEEN
Bilirubin,UR: NEGATIVE
Blood: NEGATIVE
Glucose,UR: NEGATIVE mg/dL (ref 0–75)
Leukocyte Esterase: NEGATIVE
Ph: 5 (ref 4.5–8.0)
RBC,UR: NONE SEEN /HPF (ref 0–5)

## 2013-08-17 LAB — BASIC METABOLIC PANEL
Anion Gap: 7 (ref 7–16)
Calcium, Total: 7.2 mg/dL — ABNORMAL LOW (ref 8.5–10.1)
Co2: 24 mmol/L (ref 21–32)
EGFR (African American): >60
EGFR (Non-African Amer.): >60
Osmolality: 266 (ref 275–301)
Sodium: 133 mmol/L — ABNORMAL LOW (ref 136–145)

## 2013-08-17 LAB — CBC WITH DIFFERENTIAL/PLATELET
Basophil #: 0 10*3/uL (ref 0.0–0.1)
Basophil %: 0.3 %
Eosinophil %: 0 %
Eosinophil %: 0.1 %
HCT: 18.1 % — ABNORMAL LOW (ref 35.0–47.0)
HGB: 11 g/dL — ABNORMAL LOW (ref 12.0–16.0)
Lymphocyte #: 0.6 10*3/uL — ABNORMAL LOW (ref 1.0–3.6)
Lymphocyte %: 8 %
Lymphocyte %: 5.3 %
MCH: 30 pg (ref 26.0–34.0)
MCHC: 33.2 g/dL (ref 32.0–36.0)
Monocyte %: 4.6 %
Neutrophil #: 11 10*3/uL — ABNORMAL HIGH (ref 1.4–6.5)
Neutrophil %: 87.4 %
Neutrophil %: 91 %
Platelet: 245 10*3/uL (ref 150–440)
Platelet: 262 10*3/uL (ref 150–440)
RBC: 2.01 10*6/uL — ABNORMAL LOW (ref 3.80–5.20)
RBC: 3.68 10*6/uL — ABNORMAL LOW (ref 3.80–5.20)
RDW: 19.9 % — ABNORMAL HIGH (ref 11.5–14.5)
RDW: 16.4 % — ABNORMAL HIGH (ref 11.5–14.5)
WBC: 8.8 10*3/uL (ref 3.6–11.0)
WBC: 12 10*3/uL — ABNORMAL HIGH (ref 3.6–11.0)

## 2013-08-17 LAB — MAGNESIUM: Magnesium: 1.9 mg/dL

## 2013-08-18 LAB — CBC WITH DIFFERENTIAL/PLATELET
Basophil #: 0 10*3/uL (ref 0.0–0.1)
Basophil %: 0.3 %
Eosinophil #: 0 10*3/uL (ref 0.0–0.7)
Eosinophil %: 0.1 %
HCT: 31.5 % — ABNORMAL LOW (ref 35.0–47.0)
HGB: 10.4 g/dL — ABNORMAL LOW (ref 12.0–16.0)
Lymphocyte #: 0.7 10*3/uL — ABNORMAL LOW (ref 1.0–3.6)
Lymphocyte %: 6.2 %
MCH: 29.6 pg (ref 26.0–34.0)
MCHC: 33 g/dL (ref 32.0–36.0)
MCV: 90 fL (ref 80–100)
Monocyte #: 0.4 x10 3/mm (ref 0.2–0.9)
Monocyte %: 3.7 %
Neutrophil #: 10.3 10*3/uL — ABNORMAL HIGH (ref 1.4–6.5)
Neutrophil %: 89.7 %
Platelet: 262 10*3/uL (ref 150–440)
RBC: 3.52 10*6/uL — ABNORMAL LOW (ref 3.80–5.20)
RDW: 16.3 % — ABNORMAL HIGH (ref 11.5–14.5)
WBC: 11.5 10*3/uL — ABNORMAL HIGH (ref 3.6–11.0)

## 2013-08-18 LAB — BASIC METABOLIC PANEL
BUN: 10 mg/dL (ref 7–18)
Calcium, Total: 7.3 mg/dL — ABNORMAL LOW (ref 8.5–10.1)
Co2: 24 mmol/L (ref 21–32)
EGFR (African American): >60

## 2013-08-18 LAB — OCCULT BLOOD X 1 CARD TO LAB, STOOL: Occult Blood, Feces: POSITIVE

## 2013-08-19 LAB — CBC WITH DIFFERENTIAL/PLATELET
Basophil #: 0 10*3/uL (ref 0.0–0.1)
Basophil %: 0.2 %
Eosinophil #: 0 10*3/uL (ref 0.0–0.7)
Eosinophil %: 0.1 %
HCT: 34.4 % — ABNORMAL LOW (ref 35.0–47.0)
HGB: 11.4 g/dL — ABNORMAL LOW (ref 12.0–16.0)
Lymphocyte #: 1 10*3/uL (ref 1.0–3.6)
Lymphocyte %: 7.7 %
MCH: 29.8 pg (ref 26.0–34.0)
Monocyte #: 0.5 x10 3/mm (ref 0.2–0.9)
Neutrophil #: 11.5 10*3/uL — ABNORMAL HIGH (ref 1.4–6.5)
Platelet: 283 10*3/uL (ref 150–440)
RBC: 3.81 10*6/uL (ref 3.80–5.20)
WBC: 13 10*3/uL — ABNORMAL HIGH (ref 3.6–11.0)

## 2013-08-19 LAB — BASIC METABOLIC PANEL
Calcium, Total: 7.3 mg/dL — ABNORMAL LOW (ref 8.5–10.1)
Chloride: 106 mmol/L (ref 98–107)
EGFR (African American): >60
EGFR (Non-African Amer.): >60
Glucose: 72 mg/dL (ref 65–99)
Sodium: 134 mmol/L — ABNORMAL LOW (ref 136–145)

## 2013-08-21 LAB — CULTURE, BLOOD (SINGLE)

## 2013-08-27 ENCOUNTER — Ambulatory Visit: Payer: Self-pay | Admitting: Oncology

## 2013-09-17 IMAGING — CR DG ABDOMEN 1V
1 series · 1 of 1 positions shown · non-contrast
Comparison: none

REASON FOR EXAM: ileus, nausea, vomiting
COMMENTS:

[supine kub]
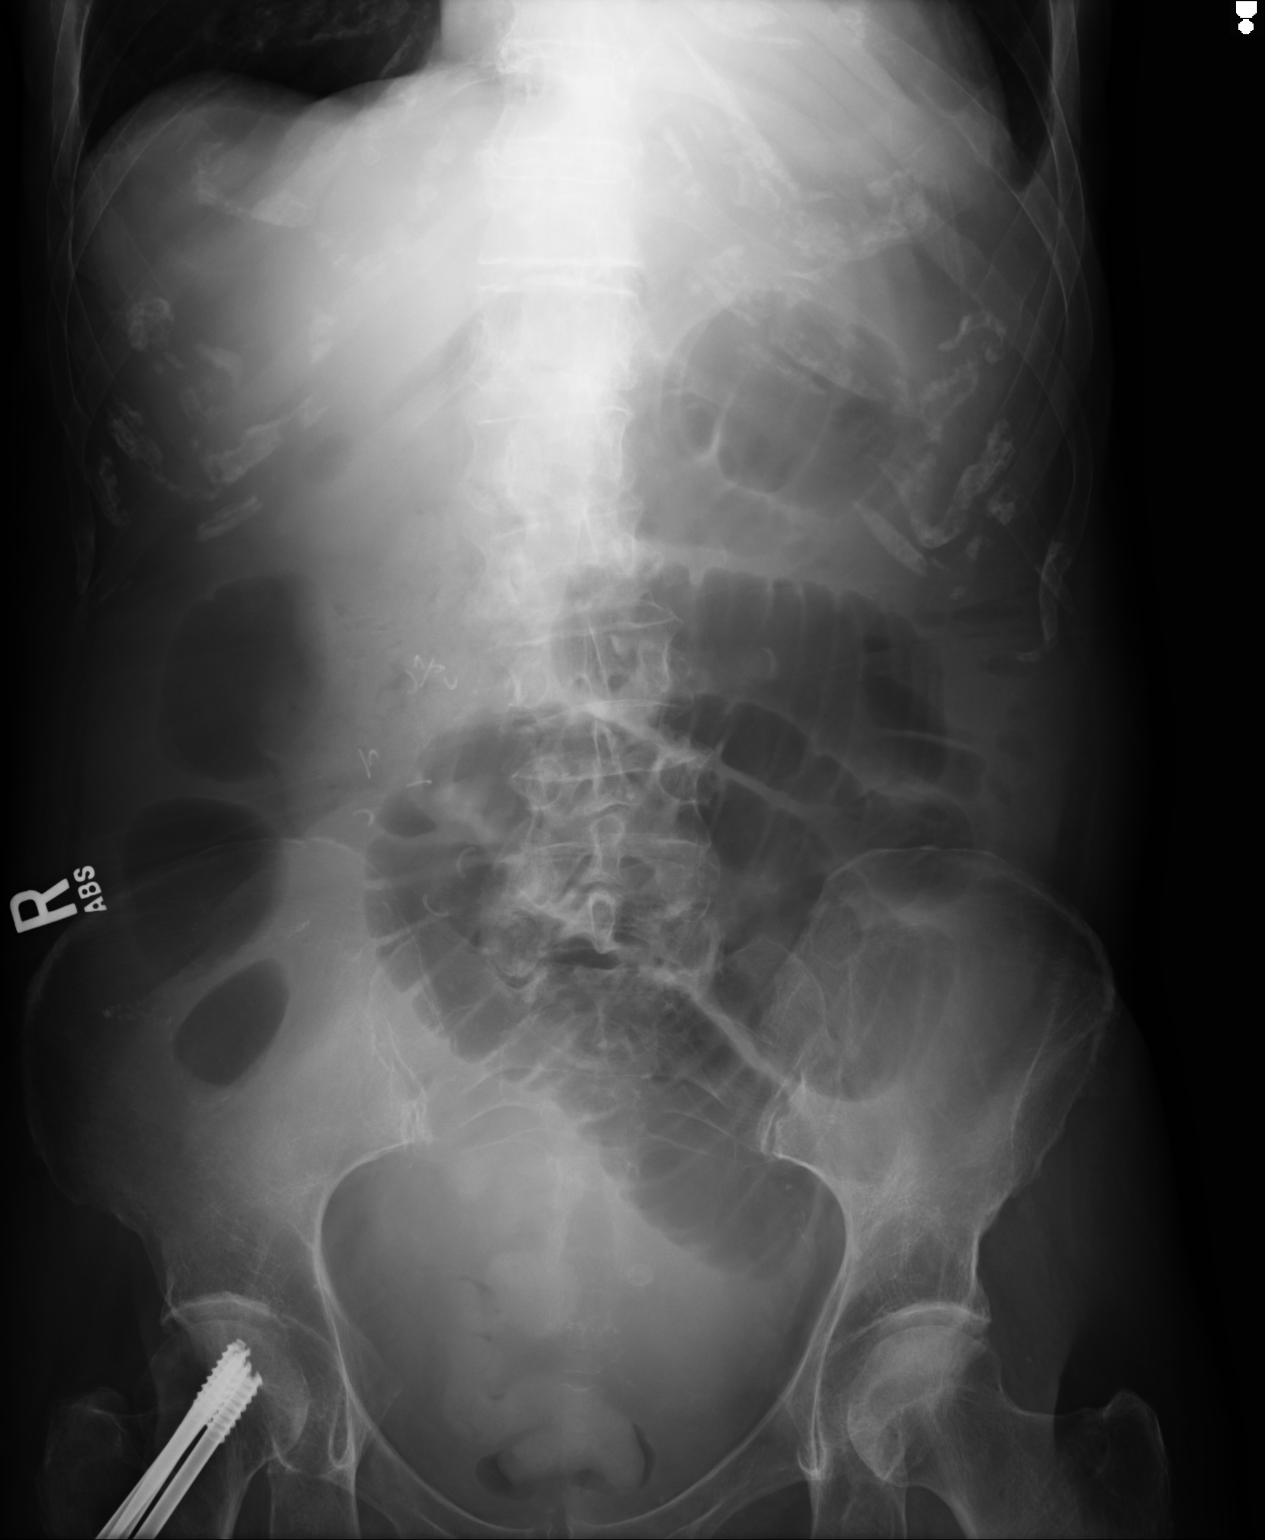

[1 of 1 positions shown; findings below may reference images not displayed]

PROCEDURE:     DXR - DXR KIDNEY URETER BLADDER  - January 30, 2013  [DATE]

RESULT:     Comparison is made to study dated 29 January, 2013. There are
persistent distended loops of small bowel consistent with small bowel
obstruction without definite perforation. There is decreasing air within
loops of colon. The stomach is not well seen on this single projection.
IMPRESSION: Resistive distended loops of small bowel with clearing of
gas from the colon concerning for small bowel obstruction as noted on the
previous CT scan.

[REDACTED]

## 2013-09-22 IMAGING — CR DG ABDOMEN 1V
1 series · 2 of 2 positions shown · non-contrast
Comparison: none

REASON FOR EXAM: SBO
COMMENTS:   LMP: N/A

[Series 13: x abdomen supine · 0.14mm/px · 2 of 2 slices shown]
[im 1/2]
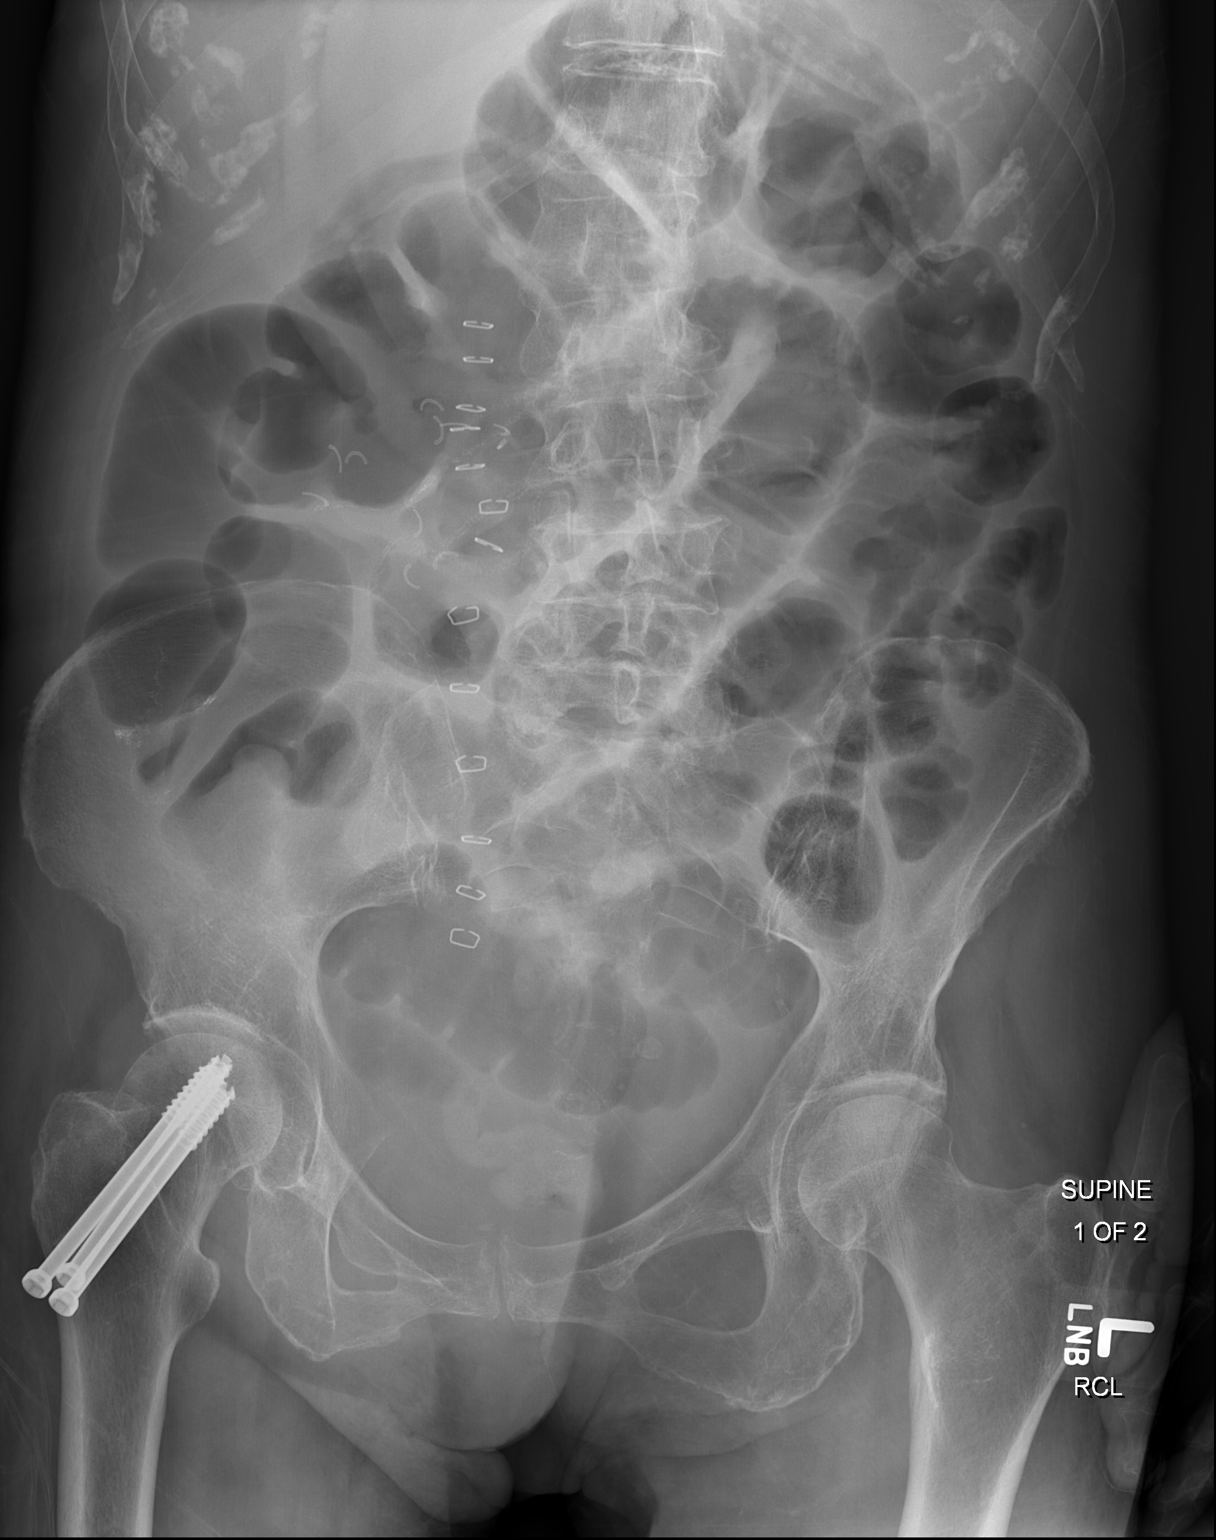
[im 2/2]
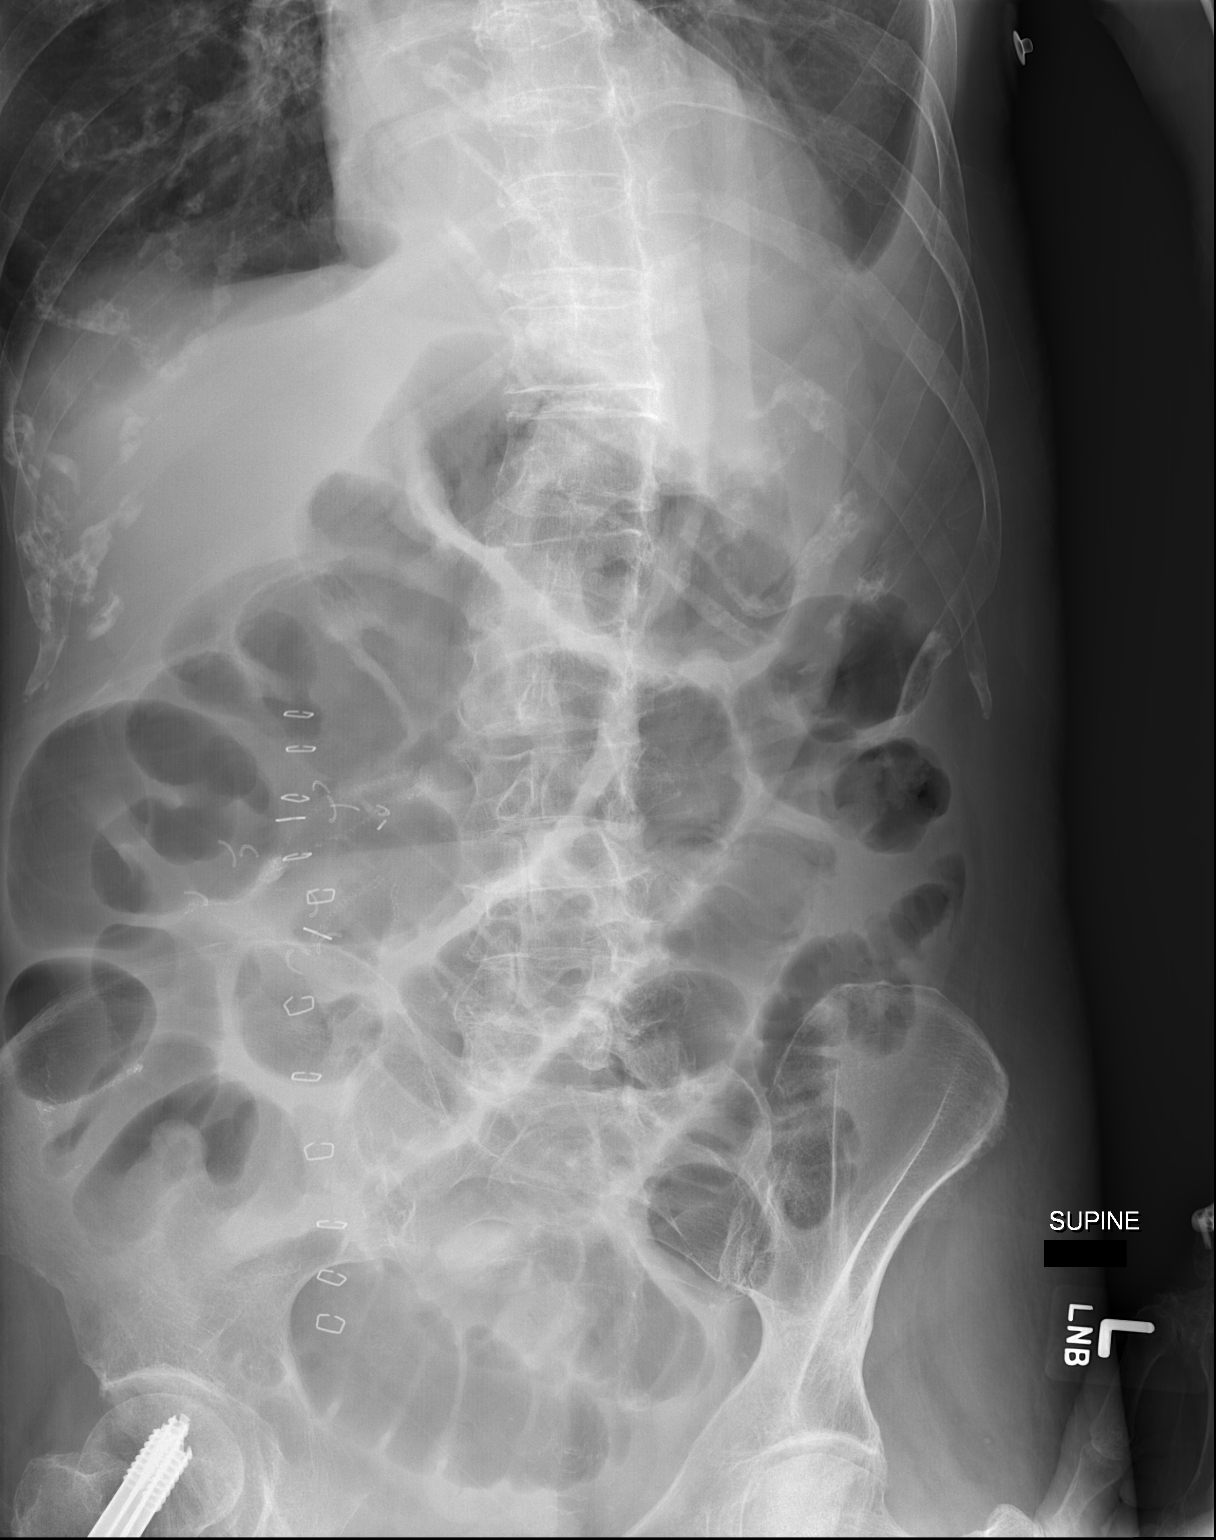

[2 of 2 positions shown; findings below may reference images not displayed]

PROCEDURE:     DXR - DXR KIDNEY URETER BLADDER  - February 04, 2013 [DATE]

RESULT:     Comparison is made to the previous exam dated 30 January, 2013.
Multiple orthopedic pins are present through the femoral neck into the
femoral head. There is dilation of multiple loops of small bowel. There
appears to be some air in the left colon and rectum. Surgical clips are
present to the right of midline. The distention in the loops of small bowel
is less than seen previously. No nasogastric tube is present.
IMPRESSION: Slight decrease in the distention of the loops of small
bowel with some progression of air into the colon. Partial obstruction or
resolving ileus are differential considerations. Correlate clinically.

[REDACTED]

## 2013-09-27 DEATH — deceased

## 2013-11-14 ENCOUNTER — Ambulatory Visit: Payer: Medicare Other | Admitting: Cardiovascular Disease

## 2014-03-15 IMAGING — CR DG CHEST 1V PORT
1 series · 1 of 1 positions shown · non-contrast
Comparison: none

REASON FOR EXAM: FEVER
COMMENTS:

[ap]
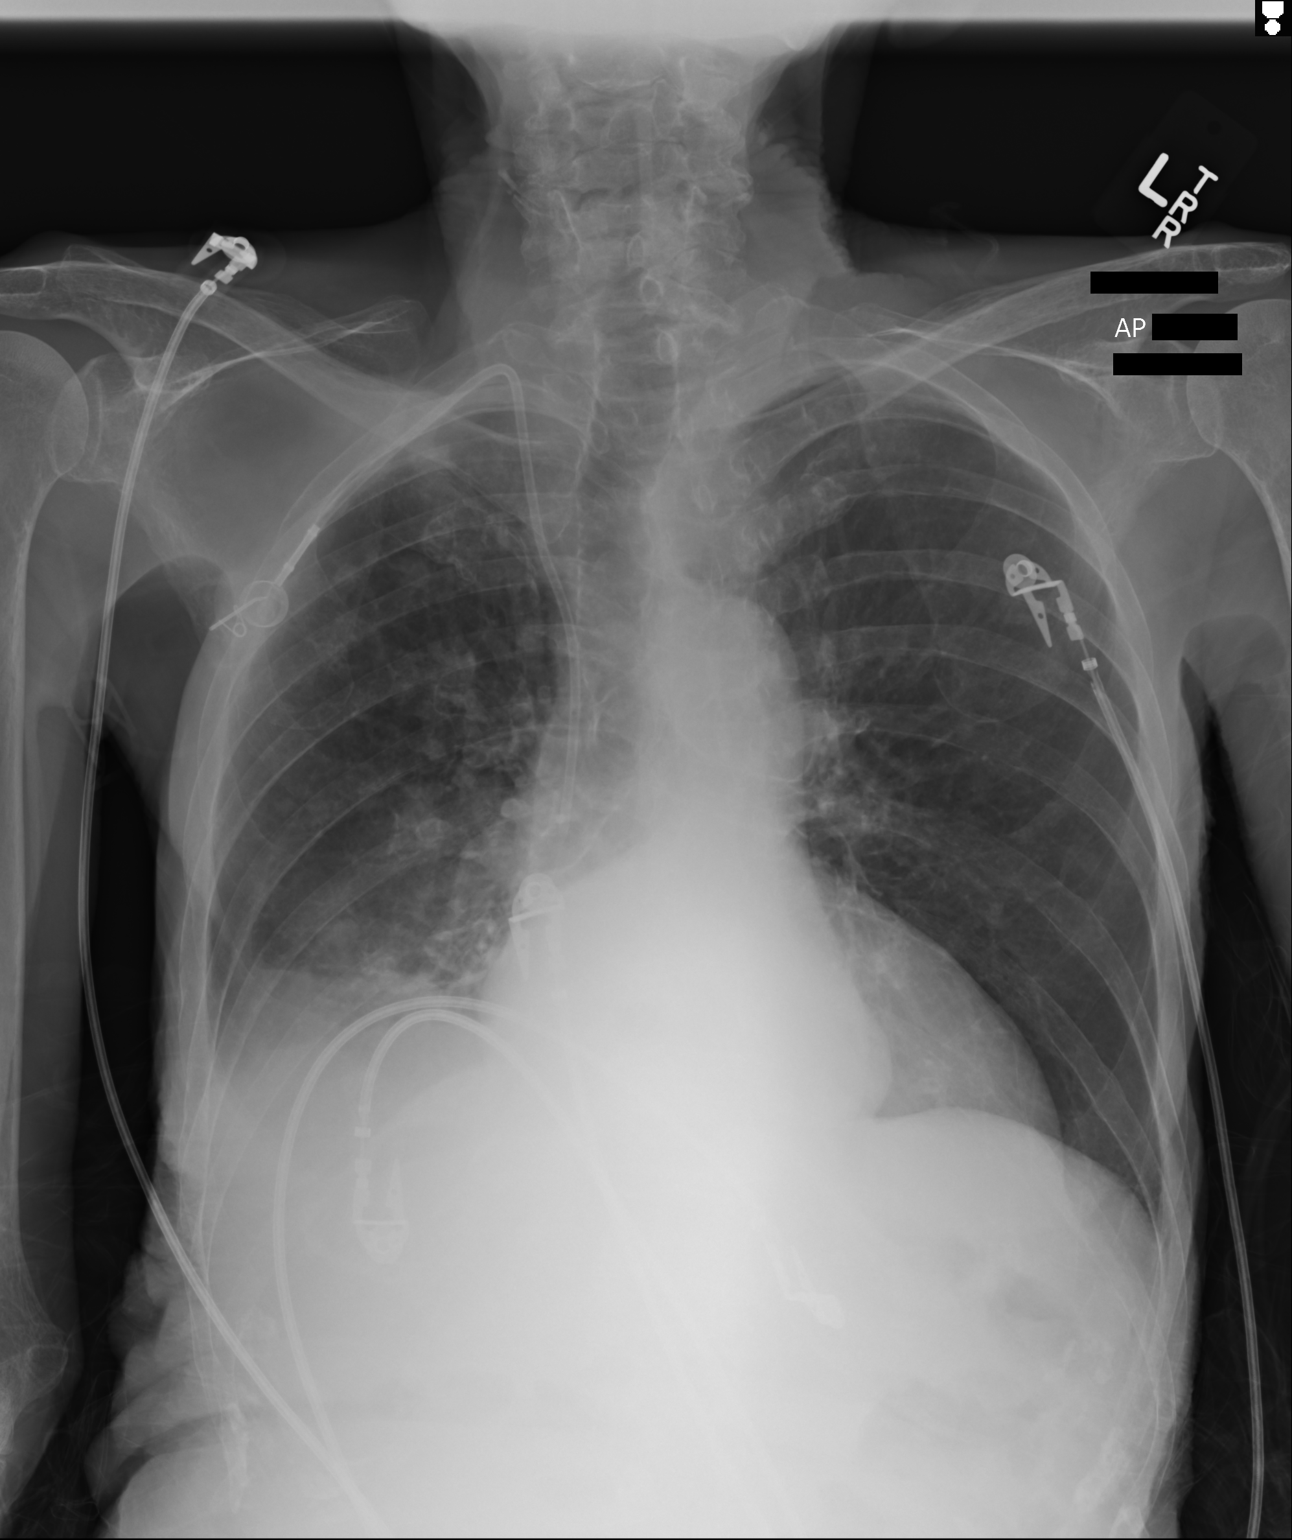

[1 of 1 positions shown; findings below may reference images not displayed]

PROCEDURE:     DXR - DXR PORTABLE CHEST SINGLE VIEW  - July 28, 2013 [DATE]

RESULT:     Comparison is made to previous exam dated 06/21/2013. A
right-sided Port-A-Cath remains in place with the tip of the catheter in the
superior vena cava near the right atrium. There is ill-defined increased
density in the right infrahilar right lung base regions with diffuse
interstitial thickening on the right. This suggests underlying right-sided
pneumonia versus pneumonitis with effusion and lung base atelectasis. The
possibility of underlying malignancy is not excluded. This does show a
worsening appearance since 06/07/2013. The left lung is clear. The bones are
osteopenic. The cardiac silhouette is mildly enlarged.
IMPRESSION: Please see above.

[REDACTED]

## 2014-04-03 IMAGING — CR DG CHEST 1V PORT
1 series · 1 of 1 positions shown · non-contrast
Comparison: none

REASON FOR EXAM: fever, r/o PNA
COMMENTS:

PROCEDURE:     DXR - DXR PORTABLE CHEST SINGLE VIEW  - August 16, 2013  [DATE]
RESULT:     Comparison: 07/28/2013

[ap]
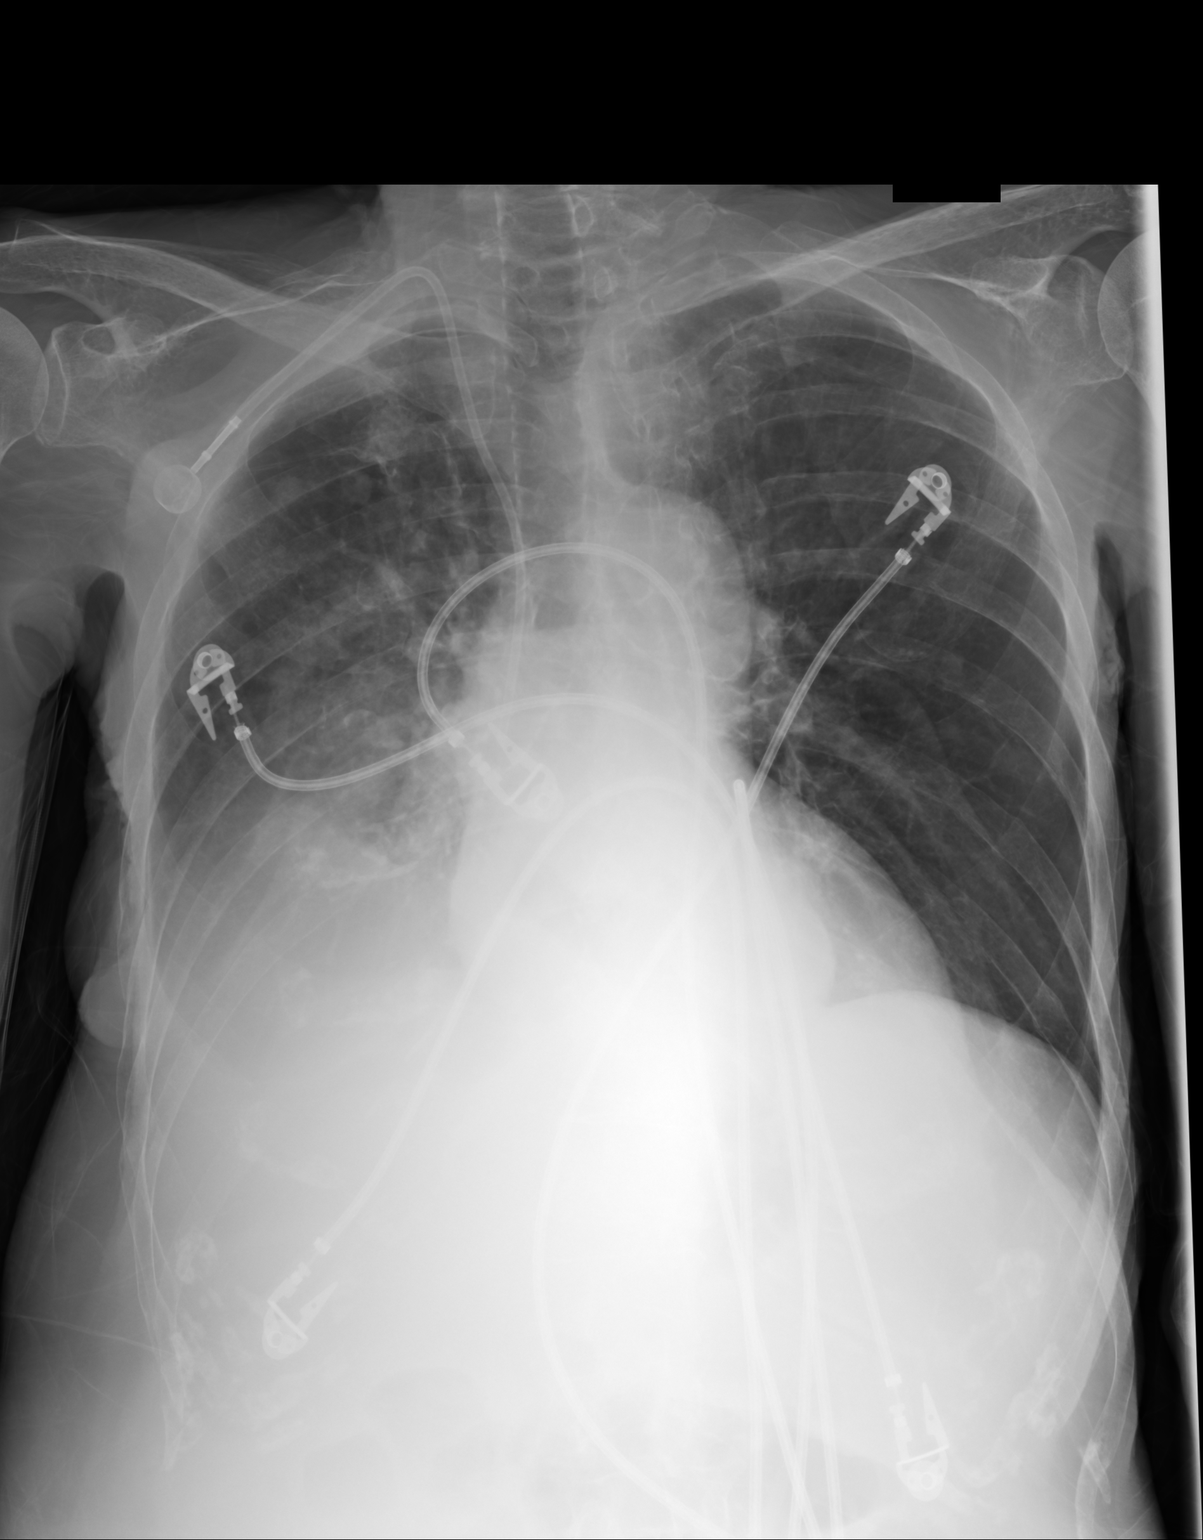

[1 of 1 positions shown; findings below may reference images not displayed]

FINDINGS: Single portable AP chest radiograph is provided. There is a small right
pleural effusion. There is no left pleural effusion. There is right lung
interstitial an alveolar airspace opacities which may reflect asymmetric
edema versus pneumonitis. There is no pneumothorax. Stable heart and
mediastinum. Right-sided Port-A-Cath. The osseous structures are
unremarkable.
IMPRESSION: Please see above.

[REDACTED]

## 2014-04-04 IMAGING — CR DG CHEST 2V
1 series · 2 of 2 positions shown · non-contrast
Comparison: none

REASON FOR EXAM: fever
COMMENTS:

PROCEDURE:     DXR - DXR CHEST PA (OR AP) AND LATERAL  - August 17, 2013  [DATE]
RESULT:     Comparison: 08/16/2013, 06/21/2013

[Series 1: ap · 0.17mm/px · 2 of 2 slices shown]
[im 1/2]
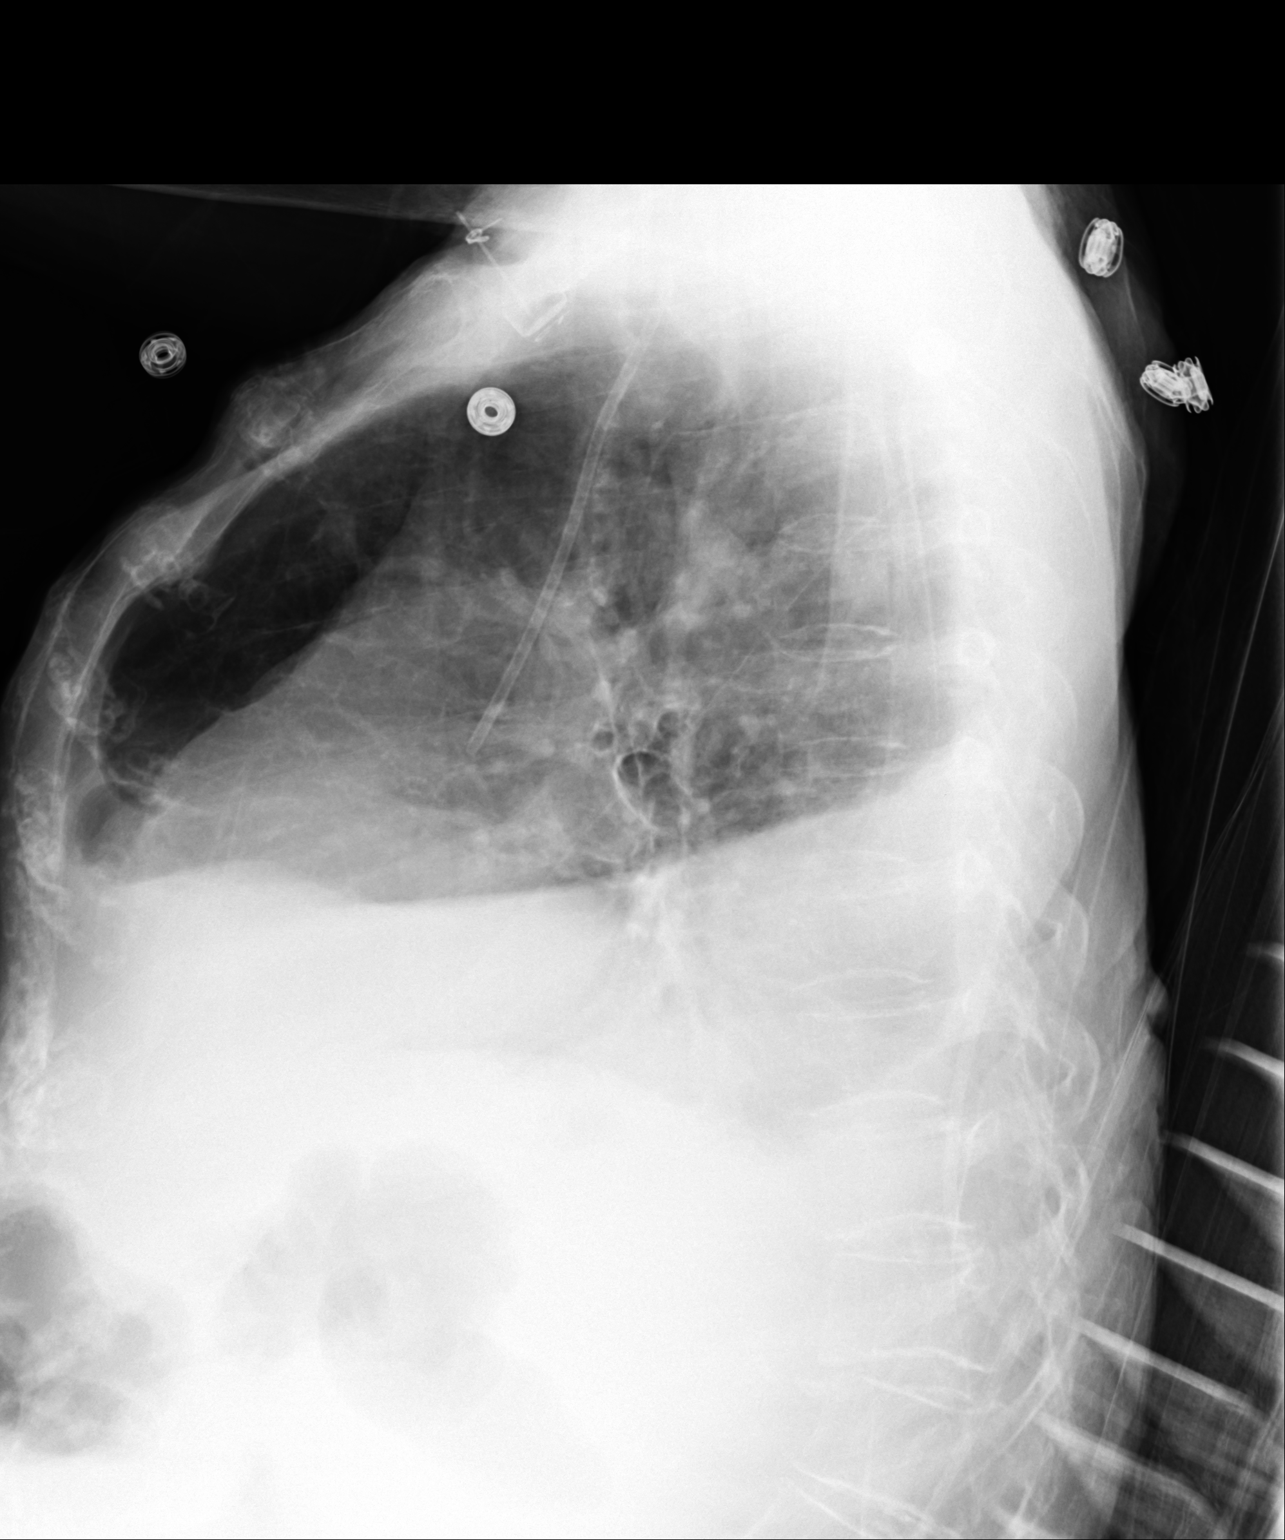
[im 2/2]
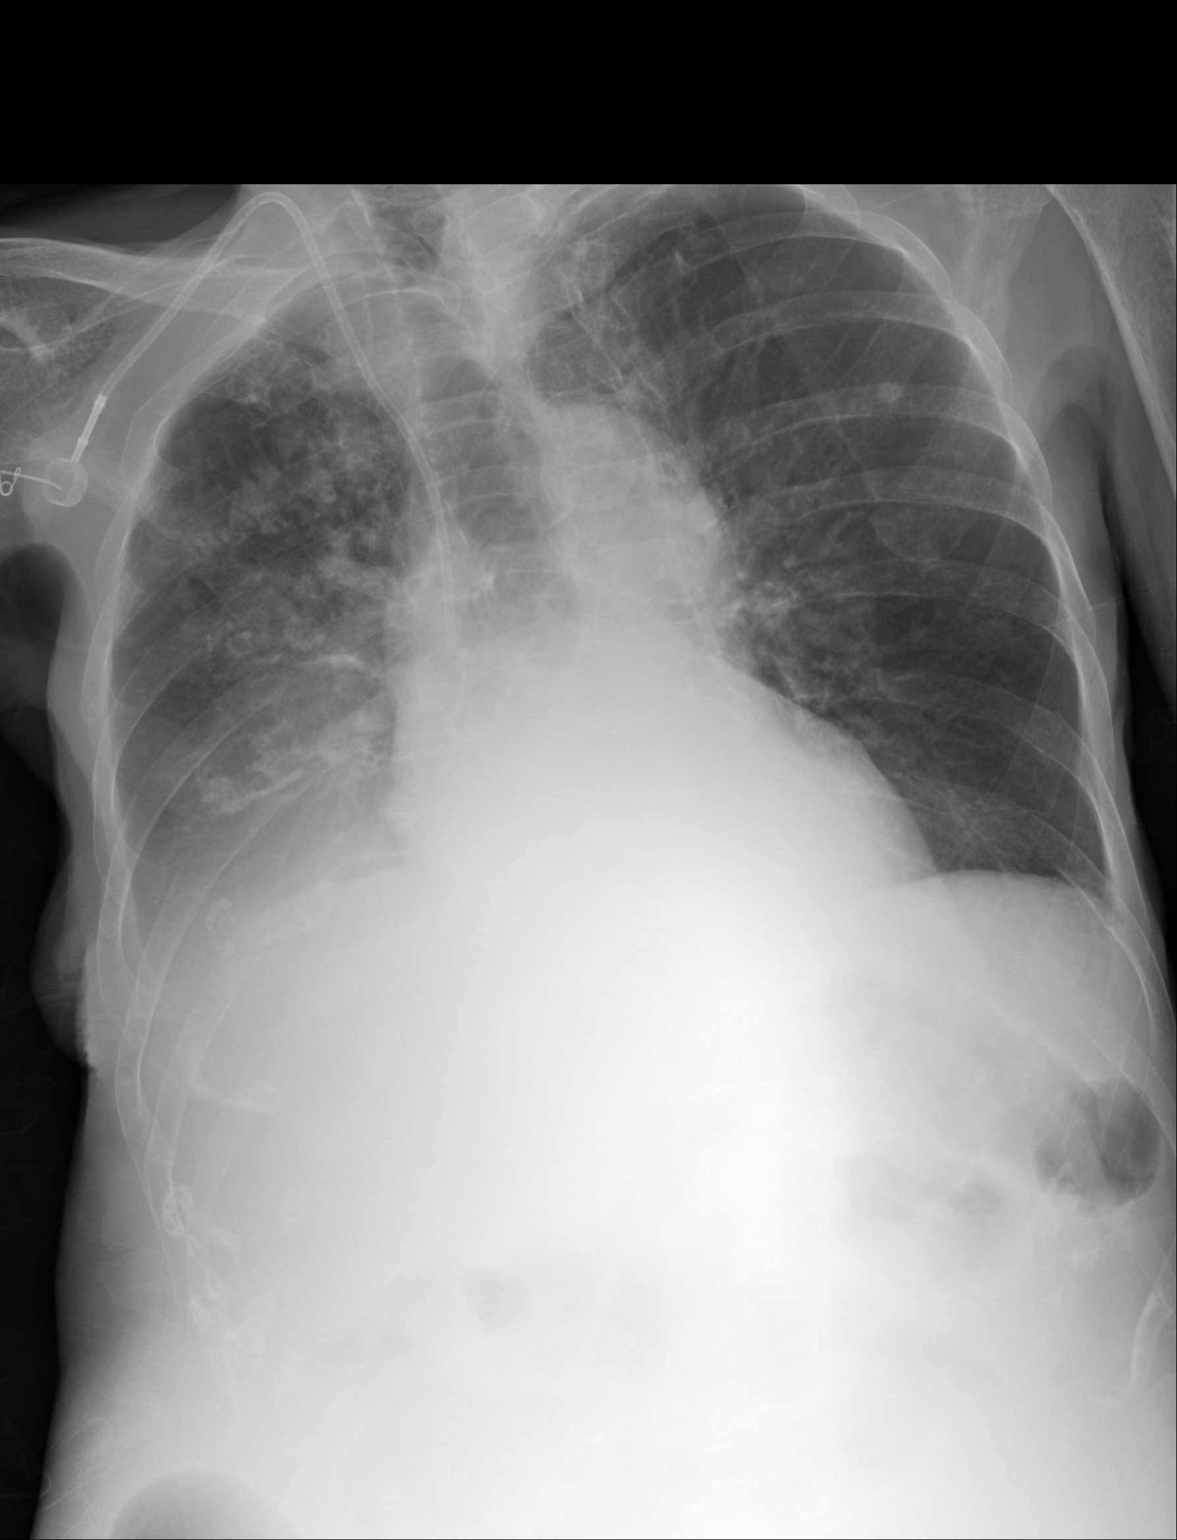

[2 of 2 positions shown; findings below may reference images not displayed]

FINDINGS: PA and lateral chest radiographs are provided. There is a small right
pleural effusion. There is a submillimeter left upper lobe pulmonary nodule.
There is no pneumothorax. There is a right-sided Port-A-Cath. Stable heart
and mediastinum. The osseous structures are unremarkable.
IMPRESSION: 1. Small right pleural effusion.

2. There is a 7 mm left upper lobe pulmonary nodule. Recommend followup CT
chest in 3-6 months.

[REDACTED]

## 2014-09-27 DEATH — deceased

## 2015-03-19 NOTE — Consult Note (Signed)
Brief Consult Note: Diagnosis: poor appetite post ex lap , h/o colon ca with peritoneal carcinomatosis.  probable ileus.   Patient was seen by consultant.   Consult note dictated.   Recommend further assessment or treatment.   Orders entered.   Comments: Please see full GI consult .  Patient seen and examined.  Paiten is about one week post op for partial sbo with lysis of adhesions and finding of peritoneal carcinomatosis c/w previous colon cancer.  Abdominal films today showing porbable ileus.  On PE there is mild distension, poor bowel sounds c/w this finding.  Will change diet to clears, non-carbonated, observe.  Some c/o constipation, no impaction on films, will start miralax after reassessment in am.  Would avoid medications with anticholinergic side effects.  Following.  Electronic Signatures: Loistine Simas (MD)  (Signed 01-Mar-14 16:49)  Authored: Brief Consult Note   Last Updated: 01-Mar-14 16:49 by Loistine Simas (MD)

## 2015-03-19 NOTE — Discharge Summary (Signed)
PATIENT NAME:  Jamie Fernandez, STURGILL MR#:  [id number] DATE OF BIRTH:  [birth year]  DATE OF ADMISSION:  08/16/2013 DATE OF DISCHARGE:  08/19/2013  The patient was discharged to hospice home.   DISCHARGE DIAGNOSES:  Stage IV colon cancer, anemia and fever of unknown origin with decubitus ulcer, severe malnutrition.   HISTORY OF PRESENTING ILLNESS:  An 81 year old female with medical history of colon cancer and chronic malnutrition with recent wound infection on left hip with MRSA and colon cancer stage IV. She finished oral therapy for MRSA wound infection last week, and since then she was having fever on and off almost every day, and so the last 2 days she was feeling more weak and was not eating enough, so decided to come to the Emergency Room. The patient was admitted with fever of unknown origin. There was no fever in the hospital. Chest x-ray was showing questionable pleural effusion, so there was a concern of having possibility of pneumonia. We checked stool for C. diff also, and blood cultures were sent. Blood cultures have remained negative. She was initially started on broad-spectrum antibiotic, but later on because her white cell count coming down, and there was no clear source of infection and blood cultures were negative, so we tapered down antibiotics to Augmentin. ID consult was called in because of complicated multiple issues of recent hospital admission and antibiotics for MRSA infection and having decubitus wounds. Wound care consult was also called in for her unstageable  ulcer and warmth and ID suggested to continue Augmentin for 3 to 4 more days.   OTHER MEDICAL ISSUES:   1.  Anemia. The patient was transfused 2 units of PRBC because of her drop in hemoglobin down to 6 and her stool was guiac positive. We called GI consult, but before it was done, the family finally decided to opt for hospice home services, and so further workup was not done for this.  2.  Hypoalbuminemia and severe  malnutrition. Dietetic consult was called in. IV albumin was given. The patient did not have good appetite and was not having good diet because of colon cancer, which was since the last few months and has been losing weight. We gave IV albumin 1 dose and dietitian was following in the hospital. Finally, she signed for hospice home.  3.  Colon cancer stage IV. Dr. Grayland Ormond was waiting for her to improve in her health so that she can tolerate chemo or any further management, as she was in very poor overall health, but finally she was transferred to hospice home.  4.  Hypertension. We continued metoprolol as she was taking before admission.   CONSULTATIONS IN THE HOSPITAL:  Palliative care consult with Dr. Ermalinda Memos and infectious disease consult with Dr. Ashley Akin.   IMPORTANT LABORATORY RESULTS IN THE HOSPITAL: WBC was 14,000 on admission. Hemoglobin was 8.4 and platelet count was 317. Creatinine was 0.66. Sodium was 132. Potassium was 4.4 and albumin was 1.2. Urinalysis was negative. Chest x-ray, portable, showed right lung interstitial and alveolar airspace opacity, which may reflect asymmetric edema versus pneumonitis, and small right pleural effusion. Blood cultures were negative. Hemoglobin dropped to 6 on the 21st of September, and after she received transfusion, hemoglobin came up to 11 on the 24th of September and then 10.4 on further followup. Stool for occult blood was positive.   TOTAL TIME SPENT ON THIS DISCHARGE: 40 minutes.  ____________________________ Ceasar Lund Anselm Jungling, MD vgv:dmm D: 08/20/2013 16:44:00 ET T: 08/20/2013 21:37:17 ET JOB#: [id number]  cc: Ceasar Lund. Anselm Jungling, MD, <Dictator> Vaughan Basta MD ELECTRONICALLY SIGNED 08/22/2013 18:36

## 2015-03-19 NOTE — Op Note (Signed)
PATIENT NAME:  Jamie Fernandez, Jamie Fernandez MR#:  [id number] DATE OF BIRTH:  28-Jul-1928  DATE OF PROCEDURE:  01/18/2013  PREOPERATIVE DIAGNOSIS:  Small bowel obstruction.  POSTOPERATIVE DIAGNOSIS:  Small bowel obstruction, multiple tumor implants.   SURGEON:  Dr. Rochel Brome.   ANESTHESIA:  General.  PROCEDURE:  Laparotomy, lysis of adhesions and biopsy.   INDICATIONS:  This 81 year old female has history of colon cancer with right colectomy.  She also has a history of carcinomatosis, has known solid tumor of the left ovary, complex cyst of the right ovary, recently presented with abdominal pain and radiographic findings of small bowel obstruction and surgery was recommended for definitive treatment.   DESCRIPTION OF PROCEDURE:  The patient was placed on the operating table in the supine position under general endotracheal anesthesia.  The abdomen was prepared with ChloraPrep, draped in a sterile manner.    An incision was made in the midline extending from just above the umbilicus down approximately 3 inches and the peritoneal cavity was opened.  There were multiple adhesions encountered dissecting loops of small bowel away from the anterior abdominal wall.  There were multiple dilated loops of small bowel.  There was a point of obstruction in the distal ileum in that there was a band of scar tissue forming a closed loop obstruction.  The loop was approximately 5 inches in length.  The bowel was markedly distended above this and decompress below this.  The band was lysed and I could see that liquid could be milked down through this portion of small bowel and also could follow the collapsed loops to the old ileocolic anastomosis.  There were also many other adhesions.  I could with two fingers palpate right ovarian cyst, however with the adhesions could not palpate in the left pelvis.  There were also multiple adhesions precluding palpation of the nasogastric tube and liver.  There were multiple tumor implants  which were small in size just measuring approximately 2 to 4 mm, two of these small tumor implants were sharply excised and submitted in formalin for routine pathology.  There was only scant bleeding during the procedure.  Several small bleeding points were cauterized.  Hemostasis was subsequently intact.  Next, the abdomen was closed with interrupted inverted figure-of-eight 0 Maxon sutures in the fascia.  The skin was closed with interrupted 4-0 nylon vertical mattress sutures.  Dressings were applied with paper tape.  The patient tolerated surgery satisfactorily and was then carried to the recovery room for postoperative care.     ____________________________ J. Rochel Brome, MD jws:ea D: 01/18/2013 09:48:55 ET T: 01/18/2013 23:01:30 ET JOB#: [id number]  cc: Loreli Dollar, MD, <Dictator> Loreli Dollar MD ELECTRONICALLY SIGNED 01/19/2013 21:10

## 2015-03-19 NOTE — Consult Note (Signed)
PATIENT NAME:  Jamie Fernandez, Jamie Fernandez MR#:  [id number] DATE OF BIRTH:  [birth year]  DATE OF CONSULTATION:  01/25/2013  REFERRING PHYSICIAN:    CONSULTING PHYSICIAN:  Lollie Sails, MD  PRIMARY CARE PHYSICIAN:  Dr. Grayland Ormond.  REASOPN FOR CONSULTATION:  Recent partial small bowel obstruction, still not taking p.o. and abdominal pain.   HISTORY OF PRESENT ILLNESS:  The patient is a very pleasant 81 year old female who was admitted to the hospital on 01/14/2013 for problems of abdominal pain. She underwent evaluation indicating a partial small bowel obstruction. She does have a history of cancer of the ascending colon necessitating partial colectomy as well as carcinomatosis. Further, she has a right adnexal mass. She did undergo exploratory laparotomy with lysis of adhesions on 01/18/2013. She was found at that time to have multiple small tumor implants/peritoneal carcinomatosis. She had a relatively uncomplicated immediate postoperative recovery and diet was advanced. However, she has been having, over the past two days or so, some sharp, but very short in duration, abdominal cramps, these being centrally in the abdomen as well as one episode of nausea but no emesis. She further states, however, that her appetite has been poor. She states she has had no bowel movement for about 3 days. She denies any problems with heartburn or difficulty swallowing. She has not had the similar pain that she had prior to her surgery, however, has had some epigastric discomfort.   PAST MEDICAL HISTORY:  Hypertension. She has also had a history of anemia. She had a right hip fracture and surgery there upon. There is a history of anemia. She does have a large sliding hiatal hernia. She has also had findings with that of small bowel herniation into the chest as well as the hiatal hernia. 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 Ovarian masses noted. She has a history of emphysema. She has  had a left wrist surgery and arthritis.   OUTPATIENT MEDICATIONS INCLUDE:  Furosemide 40 mg once a day, hydralazine 50 mg 1 twice a day, lisinopril 40 mg once a day, metoprolol 25 mg twice a day, ondansetron 4 mg every 6 hours p.r.n., pantoprazole 40 mg once a day, potassium chloride 20 mEq twice a day, Tylenol 325 mg 2 p.r.n., Viactiv multiple vitamin and vitamin D3 1000 international units once a day orally.   ALLERGIES:  ASPIRIN.   Her last colonoscopy was 07/15/2012. This showed internal hemorrhoids, otherwise normal. It did show postoperative anatomy.   PHYSICAL EXAMINATION:  VITAL SIGNS:  Temperature is 98.2, pulse 88, respirations 18, blood pressure 158/82.  GENERAL:  She is a thin, elderly appearing Caucasian female in no acute distress.  HEENT:  Normocephalic, atraumatic.  EYES:  Anicteric.  NOSE:  Septum midline. No lesions.  OROPHARYNX:  No lesions.  NECK:  No JVD. No lymphadenopathy. No thyromegaly.  HEART:  Regular rate and rhythm.  LUNGS:  Clear.  ABDOMEN:  Mildly distended. There is a mild tympany. Bowel sounds are positive, but distant and infrequent. There is a midline incision that has several sutures in place.  RECTAL:  Deferred.  EXTREMITIES:  No clubbing, cyanosis, or edema.  NEUROLOGICAL:  Cranial nerves II through XII grossly intact. Muscle strength bilaterally equal and symmetric.   LABORATORY, DIAGNOSTIC, AND RADIOLOGICAL DATA:  Include the following from today, she had a glucose of 84, BUN 12, creatinine 0.58, sodium 131, potassium 3.9, chloride 96, bicarb 29, calcium 8.1, amylase 23, lipase normal. Hepatic profile showing a total  protein of 5.4. This up slightly over the course of the last week. Her albumin 2.4, also up slightly over the course the last week. Total bilirubin 0.4, alk phos 71, AST 17, ALT 11, white count 5.1, hemoglobin and hematocrit 11.2/33.4, platelet count of 290. MCV is 87. The past pathology report from her laparotomy this past week shows the  peritoneal implants to be metastatic mucinous adenocarcinoma in the setting of a patient with known history of mucinous adenocarcinoma of the right colon that was diagnosed in 2012. She had a 3-way abdominal film today showing numerous dilated loops of small bowel with air filled levels in the small bowel and colon. This was felt to reflect possible postoperative ileus versus a low obstruction. My review of this, I agree with the likelihood of ileus. There are several areas of formed stool noted throughout the colon, these in scattered fashion. There does not appear to be a stool impaction.    ASSESSMENT: 1.  Poor appetite, abdominal pain. Multiple possible etiologies; however, I feel at this point she likely has some amount of ileus. She had decreased her intake prior to her surgery, and there was a certain amount of distention of the bowel secondary to the adhesions which were lysed during the surgery. It is quite likely that there is some amount of irritation still extant at that point. Currently there is some fecal stream going through the bowel and this may be causing the irritation in terms of the abdominal discomfort. Appetite currently is poor, likely postoperative, some question of whether there may be some amount of depression.  2.  Metastatic colon cancer.  3.  Possible ovarian mass of uncertain histology.   RECOMMENDATIONS:  We will change diet to clears without carbonation for now. Would recheck films tomorrow. We will advance diet slowly. Once she is back on solids, would continue the use of low residue rather than regular diet and avoid certain items such as a very high fiber foods. We will follow with you.   ____________________________ Lollie Sails, MD mus:jm D: 01/25/2013 16:43:51 ET T: 01/25/2013 17:30:27 ET JOB#: [id number]  cc: Lollie Sails, MD, <Dictator> Lollie Sails MD ELECTRONICALLY SIGNED 01/26/2013 10:41

## 2015-03-19 NOTE — Discharge Summary (Signed)
PATIENT NAME:  Jamie Fernandez, Jamie Fernandez MR#:  [id number] DATE OF BIRTH:  05-10-1928  DATE OF ADMISSION:  06/07/2013 DATE OF DISCHARGE:  06/11/2013   DICTATING FOR: Jeneen Rinks P. Holley Bouche., MD  ADMITTING DIAGNOSIS: Fracture, left hip.   DISCHARGE DIAGNOSIS: Fracture, left hip.    ADMITTING PHYSICIAN: Claud Kelp, MD, orthopedic surgeon locum.   HOSPITALIST: [place] Sink, MD  ORTHOPEDIC: Laurice Record. Holley Bouche., MD  HISTORY: The patient is an 81 year old female who has been treated for colon cancer by Dr. Raul Del. On the day of admission, she fell off the couch and immediately had pain and was unable to stand or bear weight to the left lower extremity. She subsequently was brought to [facility] ER, where x-rays were taken, which showed a left displaced femoral neck fracture. After discussion of the risks and benefits of surgical intervention, the patient expressed her understanding of the risks and benefits and agreed for plans for surgical intervention. The patient was subsequently admitted to the hospital. The patient was seen by the hospitalist for medical clearance, which was granted.   PROCEDURE: Left hip hemiarthroplasty.   ANESTHESIA: General.   IMPLANTS UTILIZED: Unknown other than size, which was a size 4 femoral implant, size 3 femoral restrictor, a size 44 head.   HOSPITAL COURSE: The patient tolerated the procedure very well. She had no complications. She was then taken to the PACU, where she was stabilized and then transferred to the orthopedic floor. The patient began receiving anticoagulation therapy of Lovenox 30 mg subcutaneous daily per hospital pharmacy and anesthesia protocol. She was fitted with TED stockings bilaterally. These were allowed to be removed 1 hour per 8-hour shift. The patient was also fitted with AVI compression foot pumps bilaterally set at 80 mmHg. Her calves have been nontender. There has been no evidence of any DVTs. Negative Homans  sign. Heels were elevated off the bed using rolled towels.   The patient has denied any chest pain or shortness of breath. Vital signs have been stable. She has been afebrile. Hemodynamically, her blood did drop to 6.9, and subsequently, 1 unit of packed RBCs was administered. She was asymptomatic vital sign-wise; however, she had extreme pallor, and there was a fear that the patient, standing to begin physical therapy, would become hypotensive and subsequently fall, sustaining further injury.   Physical therapy was initiated on day 1 for gait training and transfers. She has done very well and has been very cooperative. Upon being discharged, was ambulating greater than 50 feet. Occupational therapy was also initiated on day 1 for ADLs and assistive devices.   The patient's IV, Foley and Hemovac were discontinued on day 2 along with a dressing change. The wound was free of any drainage or signs of infection.   The patient was initially started on Levaquin after her second UA came back showing a trace of bacteria and WBC. However, this was discontinued by Dr. Posey Pronto, the hospitalist. They felt that the elevated WBC was due to a neurogenic condition and not a bacteria.   DISPOSITION: The patient is being discharged to skilled nursing facility in improved stable condition.   DISCHARGE INSTRUCTIONS:  1. She may weight bear as tolerated.  2. Elevate heels off the bed. Pillows between the legs when in bed.  3. Continue with TED stockings. These are allowed to be removed 1 hour per 8-hour shift.  4. Incentive spirometer q.1 hour while awake.  5. Encourage cough and deep breathing q.2 hours while  awake.  6. She is placed on a regular diet.  7. Change dressing as needed.  8. Staples are to be removed on July 26th and apply benzoin and 1/2-inch Steri-Strips.  9. Call the clinic if any temperatures of 101.5 or greater or excessive bleeding.  10. PT for gait training and transfers. OT for ADLs and assistive  devices.  11. She is not to take a shower until the staples are removed.  12. She will need to be seen in the clinic in 6 weeks for re-evaluation, sooner if any problems.   DRUG ALLERGIES: ASPIRIN.   MEDICATIONS:  1. Vitamin D3 1000 units daily.  2. Lasix 40 mg daily. 3. Zestril 40 mg daily. 4. Lopressor 25 mg b.i.d.  5. Ocuvite PreserVision 1 tablet daily. 6. Pantoprazole 40 mg daily.  7. Potassium chloride 20 mEq b.i.d.  8. Lovenox 30 mg subcutaneous daily for 14 days, then discontinue.  9. Tylenol ES 500 to 1000 mg q.4-6 hours p.r.n. 10. Norco 5/325 one tablet q.4 hours p.r.n. for pain.  11. Alprazolam 0.25 mg q.8 hours p.r.n. for anxiety.  12. Dulcolax suppository 10 mg rectally q.6 hours p.r.n.  13. Enema soapsuds if no results with Milk of Magnesia or Dulcolax.  14. Ethyl chloride topical spray apply to Port-A-Cath p.r.n. 15. Lactulose 30 mL b.i.d. p.r.n.  16. Milk of Magnesia 30 mL b.i.d.  17. Zofran 4 mg q.6 hours p.r.n.   PAST MEDICAL HISTORY:  1. Hypertension.  2. Colon cancer. 3. Arthritis. 4. Chronic anemia.  ____________________________ Vance Peper, PA jrw:OSi D: 06/11/2013 08:04:00 ET T: 06/11/2013 08:21:06 ET JOB#: [id number]  cc: Vance Peper, PA, <Dictator> Hriday Stai PA ELECTRONICALLY SIGNED 06/17/2013 7:44

## 2015-03-19 NOTE — Discharge Summary (Signed)
PATIENT NAME:  Jamie Fernandez, Jamie Fernandez MR#:  [id number] DATE OF BIRTH:  [birth year]  DATE OF ADMISSION:  06/07/2013 DATE OF DISCHARGE:  06/12/2013   Addendum  The initial date of discharge was to be on 06/11/2013, and she was held overnight because of fluid retention. The patient was unable to go to rehab, but they did in-and-out catheterization, and the patient improved. The patient also had transfusion on the 16th with a hemoglobin up to 10.1 after the transfusion. The patient was doing much better and was ready to go to rehab with no other change in her orders.   ____________________________ Lenna Sciara. Reche Dixon, [place] jtm:OSi D: 06/12/2013 07:31:26 ET T: 06/12/2013 08:11:43 ET JOB#: [id number]  cc: J. Reche Dixon, [place], <Dictator> J Vollie Brunty [facility] PA ELECTRONICALLY SIGNED 06/18/2013 10:28

## 2015-03-19 NOTE — Consult Note (Signed)
Chief Complaint:  Subjective/Chief Complaint continues with abd pain, though less than yesterday.  passing flatus, no bm yet despite enemas earlier today.  poor appetite, mild nausea no emesis.   VITAL SIGNS/ANCILLARY NOTES: **Vital Signs.:   04-Mar-14 14:07  Vital Signs Type Routine  Temperature Temperature (F) 97.9  Celsius 36.6  Pulse Pulse 73  Respirations Respirations 18  Systolic BP Systolic BP 505  Diastolic BP (mmHg) Diastolic BP (mmHg) 68  Mean BP 94  Pulse Ox % Pulse Ox % 94  Pulse Ox Activity Level  At rest  Oxygen Delivery Room Air/ 21 %   Brief Assessment:  Cardiac Irregular   Respiratory clear BS   Gastrointestinal details normal mild distension, bs positive, mile diffuse tenderness.   Lab Results: Routine Chem:  04-Mar-14 06:38   Glucose, Serum 66  BUN 9  Creatinine (comp)  0.49  Sodium, Serum  129  Potassium, Serum  3.4  Chloride, Serum  95  CO2, Serum 24  Calcium (Total), Serum  7.7  Anion Gap 10  Osmolality (calc) 256  eGFR (African American) >60  eGFR (Non-African American) >60 (eGFR values <36m/min/1.73 m2 may be an indication of chronic kidney disease (CKD). Calculated eGFR is useful in patients with stable renal function. The eGFR calculation will not be reliable in acutely ill patients when serum creatinine is changing rapidly. It is not useful in  patients on dialysis. The eGFR calculation may not be applicable to patients at the low and high extremes of body sizes, pregnant women, and vegetarians.)  Magnesium, Serum 1.8 (1.8-2.4 THERAPEUTIC RANGE: 4-7 mg/dL TOXIC: > 10 mg/dL  -----------------------)   Radiology Results: XRay:    04-Mar-14 07:56, KUB - Kidney Ureter Bladder  KUB - Kidney Ureter Bladder   REASON FOR EXAM:    re-assess ileus.  COMMENTS:   LMP: N/A    PROCEDURE: DXR - DXR KIDNEY URETER BLADDER  - Jan 28 2013  7:56AM     RESULT: Comparisons:  None    Findings:      Supine radiograph of the abdomen is  provided.    There is severe gaseous distention of small bowel and colon. There is   contrast seen within the descending colon. The appearance is concerning   for an ileus. A low bowel obstruction cannot be excluded. There is no   pathologic calcification along the expected course of theureters. There     is no evidence of pneumoperitoneum, portal venous gas, or pneumatosis.    The osseous structures are unremarkable.    IMPRESSION:     There is severe gaseous distention of small bowel and colon. There is   contrast seen within the descending colon. The appearance is concerning   for an ileus. A low bowel obstruction cannot be excluded. There is no   pathologic calcification along the expected course of the ureters.    Dictation Site: 1        Verified By: HJennette Banker M.D., MD   Assessment/Plan:  Assessment/Plan:  Assessment 1) post operative ileus, clinically less distension this evening than yesterday.  2) history of colon cancer now with peritoneal carcinomatiou, and new mss in left ovary.   Plan 1) agree with enemas, using miralax, discussed with Dr STamala Julian Dr FGrayland Ormond No plans for luminal evaluation at this point.   Electronic Signatures: SLoistine Simas(MD)  (Signed 04-Mar-14 21:00)  Authored: Chief Complaint, VITAL SIGNS/ANCILLARY NOTES, Brief Assessment, Lab Results, Radiology Results, Assessment/Plan   Last Updated: 04-Mar-14 21:00 by  Loistine Simas (MD)

## 2015-03-19 NOTE — Consult Note (Signed)
Chief Complaint:  Subjective/Chief Complaint seen for recurrent SBO.  continues with generalized discomfort.  denies nausea or vomiting, generalized abdominal discomfort, no flauts or bm today.   VITAL SIGNS/ANCILLARY NOTES: **Vital Signs.:   10-Mar-14 13:41  Vital Signs Type Routine  Temperature Temperature (F) 98.1  Celsius 36.7  Temperature Source oral  Pulse Pulse 101  Respirations Respirations 22  Systolic BP Systolic BP 559  Diastolic BP (mmHg) Diastolic BP (mmHg) 76  Mean BP 104  Pulse Ox % Pulse Ox % 94  Pulse Ox Activity Level  At rest  Oxygen Delivery Room Air/ 21 %   Brief Assessment:  Cardiac Irregular   Respiratory clear BS   Gastrointestinal details normal midline bandage in place, mild distension, some mild generalized discomfort, bowel sounds positive but slow.  no high pitched sounds.   Lab Results: General Ref:  07-Mar-14 04:10   Prealbumin ========== TEST NAME ==========  ========= RESULTS =========  = REFERENCE RANGE =  PROFILE [id number]  Prealbumin Prealbumin                      [L  4 mg/dL              ]             20-40               [facility]            No: [id number]           [address], [place], [place] [address]           Lindon Romp, MD         [id number]   Result(s) reported on 01 Feb 2013 at 05:48AM.  Routine Chem:  10-Mar-14 04:41   Sodium, Serum  131 (Result(s) reported on 03 Feb 2013 at 05:06AM.)  Potassium, Serum 4.2 (Result(s) reported on 03 Feb 2013 at 05:06AM.)  Magnesium, Serum  1.7 (1.8-2.4 THERAPEUTIC RANGE: 4-7 mg/dL TOXIC: > 10 mg/dL  -----------------------)  Phosphorus, Serum 2.5 (Result(s) reported on 03 Feb 2013 at 05:08AM.)  Calcium (Total), Serum  7.1 (Result(s) reported on 03 Feb 2013 at 05:06AM.)   Assessment/Plan:  Assessment/Plan:  Assessment 1) personal history of colon cancer s/p resection, now with peritoneal carcinomatosis 2) ovarian mass 3) recurrent sbo. 4) peritoneal  carcinomatosis   Plan 1) continue current, no new GI recs. conrinuing on IV feeds/hyperalimentation.  will follow at a distance.   Electronic Signatures: Loistine Simas (MD)  (Signed 10-Mar-14 15:35)  Authored: Chief Complaint, VITAL SIGNS/ANCILLARY NOTES, Brief Assessment, Lab Results, Assessment/Plan   Last Updated: 10-Mar-14 15:35 by Loistine Simas (MD)

## 2015-03-19 NOTE — Consult Note (Signed)
Chief Complaint:  Subjective/Chief Complaint seen for abdominal pain.  Patient continuing with crampy abdominal apin.  no bm for several days, was statred on stool softener earlier.  mild nausea.   VITAL SIGNS/ANCILLARY NOTES: **Vital Signs.:   02-Mar-14 10:15  Vital Signs Type Q 4hr  Temperature Temperature (F) 97.8  Celsius 36.5  Temperature Source oral  Pulse Pulse 85  Respirations Respirations 18  Systolic BP Systolic BP 239  Diastolic BP (mmHg) Diastolic BP (mmHg) 80  Mean BP 109  Pulse Ox % Pulse Ox % 96  Pulse Ox Activity Level  At rest  Oxygen Delivery Room Air/ 21 %   Brief Assessment:  Cardiac Irregular   Respiratory clear BS   Gastrointestinal details normal No rebound tenderness  mild distension,  mild increase from yesterday.   bowel sounds improved to hyperactive.   Additional Physical Exam DRE-no stool in vault, firm estrinsic region anterior to the rectal vault noted.   Lab Results: Routine Chem:  02-Mar-14 05:12   Glucose, Serum 69  BUN 11  Creatinine (comp)  0.57  Sodium, Serum  132  Potassium, Serum 3.8  Chloride, Serum  97  CO2, Serum 29  Calcium (Total), Serum  8.1  Anion Gap  6  Osmolality (calc) 262  eGFR (African American) >60  eGFR (Non-African American) >60 (eGFR values <42m/min/1.73 m2 may be an indication of chronic kidney disease (CKD). Calculated eGFR is useful in patients with stable renal function. The eGFR calculation will not be reliable in acutely ill patients when serum creatinine is changing rapidly. It is not useful in  patients on dialysis. The eGFR calculation may not be applicable to patients at the low and high extremes of body sizes, pregnant women, and vegetarians.)   Radiology Results: XRay:    02-Mar-14 07:42, Abdomen AP Only  Abdomen AP Only   REASON FOR EXAM:    h/o sbo, pain, nausea.  COMMENTS:       PROCEDURE: DXR - DXR ABDOMEN AP ONLY  - Jan 26 2013  7:42AM     RESULT: Comparisons:  None    Findings:       Supine radiograph of the abdomen is provided.    There is a nonspecific bowel gaspattern. There is no bowel dilatation to   suggest obstruction. There is no pathologic calcification along the   expected course of the ureters. There is no evidence of pneumoperitoneum,   portal venous gas, or pneumatosis.  The osseous structures are unremarkable.    IMPRESSION:     Unremarkable KUB.    Dictation Site: 1        Verified By: HJennette Banker M.D., MD  CT:    20-Feb-14 11:22, CT Abdomen and Pelvis With Contrast  CT Abdomen and Pelvis With Contrast   REASON FOR EXAM:    (1) abdominal pain, bowel obstruction; (2) same  COMMENTS:       PROCEDURE: CT  - CT ABDOMEN / PELVIS  W  - Jan 16 2013 11:22AM     RESULT:     Technique: Helical 3 mm sections were obtained from the lung bases   through the pubicsymphysis status post intravenous administration of 100   ml of Isovue-300 and oral contrast. This study is compared to a previous   study dated 12/09/2012.     Findings: A large, contrast containing, hiatal hernia is identified.   Atelectasis versus infiltrate as well as small effusions is identified   within the lung bases.  A  nonenhancing, low attenuating focus is identified within the dome of   the left lobe of the liver, stable when compared to the previous study   and likely representing a cyst. The liver is otherwise unremarkable. The   spleen, adrenals, and pancreas is unremarkable. Evaluation of the kidneys   demonstrates small, subcentimeter, low attenuating foci within the right   and left kidneys and likely representing cysts.    Multiple, dilated fluid, air, and contrast filled loops of small bowel   are appreciated throughout the abdomen and pelvis. The dilated loops   appear to extend to the terminal ileum. The colon is primarily   decompressed and a small amount of fecal retention is identified.   Surgical clips are appreciated within the right lower quadrant. The    patient has reported history of a colectomy and the CT appearance appears   to reflect a partial colectomy.    The gallbladder is distended measuring 4.62 x4.83 cm in AP x transverse   dimensions.    A mixed, complex, solid and cystic appearing mass is once again   appreciated within the right adnexal region. The uterus is appreciated   containing heterogeneous masses in the area which appears to be exophytic   and extending into the right adnexa. The mixed solid and cystic mass in   the right adnexa measures approximately 0.73 x 8.21 cm in AP x transverse   dimensions. A minimal amount of ascites is identified within the abdomen.    The urinary bladder is dilated and appears to contain urine.    IMPRESSION:     1.  High-grade small bowel obstruction which appears to extend to the     level of the terminal ileum or possibly neo-terminal ileum.  2.  Cystic mass within the pelvis and differential considerations are of   ovarian origin though considering the patient's history, GI origin cannot   be excluded.  3.  Large, hiatal hernia.  4.  Trace to small amount of ascites.  5.  Possibly leiomyomata within the uterus. If the patient has a history   of hysterectomy, mass or more ominous etiologies is of diagnostic   consideration in the pelvis.   6.  GYN consultation is recommended considering the cystic finding within   the pelvis as well as the possible uterine findings.  7.  Dilated urinary bladder which may represent bladder outlet   obstruction.    Thank you for this opportunity to contribute to the care of your patient.         Verified By: Mikki Santee, M.D., MD   Assessment/Plan:  Assessment/Plan:  Assessment 1) abdominal pain, bloating-abdominal films improved today, still some lesser amount of distended colon.   Plan 1) agree with stool softener.  no new recs.  of note, on last colonoscopy 6-7 months ago, colon was felt to be redundant, multiple sharp turns, but  no evidence of obstructive lesions.   Electronic Signatures: Loistine Simas (MD)  (Signed 02-Mar-14 13:46)  Authored: Chief Complaint, VITAL SIGNS/ANCILLARY NOTES, Brief Assessment, Lab Results, Radiology Results, Assessment/Plan   Last Updated: 02-Mar-14 13:46 by Loistine Simas (MD)

## 2015-03-19 NOTE — Consult Note (Signed)
Ms. Flanigan 81 yr old admittted to [facility] service for SBO. Developed a. fib with RVR and was consulted by suregry. plan to take for SBO. HTNAfib with RVRDebilitySBOcolon ca. with antihypertensive meds.improvement with lopressor. Cards is following with patient.with NG tube, plan to take for surgery.    Electronic Signatures: Monica Becton (MD)  (Signed on 21-Feb-14 05:03)  Authored  Last Updated: 21-Feb-14 05:03 by Monica Becton (MD)

## 2015-03-19 NOTE — Consult Note (Signed)
Brief Consult Note: Diagnosis: left hip abscess.   Patient was seen by consultant.   Recommend further assessment or treatment.   Orders entered.   Comments: Have discussed with Dr Marry Guan, will apply wound vac later today.  Electronic Signatures: Laurene Footman (MD)  (Signed 02-Sep-14 17:00)  Authored: Brief Consult Note   Last Updated: 02-Sep-14 17:00 by Laurene Footman (MD)

## 2015-03-19 NOTE — H&P (Signed)
   Subjective/Chief Complaint Left hip pain   History of Present Illness 81 y/o female with h/o colon cancer who dfell off the couch today with immediate pain and inability to bear weight on her left lower extremity   Past History Colon Cancer HTN GERD right hip fracture left wrist fracture   Past Medical Health Hypertension, Cancer   Past Med/Surgical Hx:  Hypertension:   colon cancer:   Arthritis:   anemia:   denies:   colectomy:   Wrist Surgery - Left:   Hip Surgery - Right:   ALLERGIES:  Aspirin: Angioedema, Swelling  Family and Social History:  Family History Non-Contributory   Place of Living Home  With daughter and home health support   Review of Systems:  Fever/Chills No   Cough No   SOB/DOE No   Chest Pain No   Physical Exam:  GEN well developed, well nourished   NECK supple   RESP normal resp effort   CARD no murmur   GU foley catheter in place   EXTR positive edema, bLE pitting edema, LLE shortened and externally rotated   SKIN positive ulcers, chronic healing lower back ulcer   NEURO motor/sensory function intact   PSYCH A+O to time, place, person    Assessment/Admission Diagnosis Left displaced femoral neck fracture   Plan Admit for medical clearance and left hip hemiarthroplasty   Electronic Signatures: Claud Kelp (MD)  (Signed 12-Jul-14 18:15)  Authored: CHIEF COMPLAINT and HISTORY, PAST MEDICAL/SURGIAL HISTORY, ALLERGIES, FAMILY AND SOCIAL HISTORY, REVIEW OF SYSTEMS, PHYSICAL EXAM, ASSESSMENT AND PLAN   Last Updated: 12-Jul-14 18:15 by Claud Kelp (MD)

## 2015-03-19 NOTE — Consult Note (Signed)
PATIENT NAME:  Jamie Fernandez, PEGUES MR#:  [id number] DATE OF BIRTH:  February 26, 1928  DATE OF CONSULTATION:  08/18/2013  REFERRING PHYSICIAN:  Vaughan Basta, MD CONSULTING PHYSICIAN:  Cheral Marker. Ola Spurr, MD  REASON FOR CONSULTATION: Fevers, leukocytosis.   HISTORY OF PRESENT ILLNESS: This is a very pleasant but unfortunate  81 year old female with stage IV colon cancer as well as chronic severe malnutrition. The patient was brought in on September 20th by family when she was noted to have fevers up to 101 at home. She also was noted to have decreased oral intake. In the ED, she was noted to have a white count elevated to 14.1. She was admitted and treated for possible pneumonia given a recent hospitalization. However, the patient denies that she had any change in her pulmonary status or cough prior to admission.   Of note, the patient was admitted September 1st to 4th with MRSA wound infection over her left hip. She had fallen and broken her hip and had surgical repair in July of 2014. She was treated initially with vancomycin and then Bactrim. This seems to have resolved, per the patient. She finished taking these antibiotics on September 13th.   PAST MEDICAL HISTORY: 1.  Stage IV colon cancer status post surgery and chemo.  2.  Left hip fracture status post ORIF in July of 2014 followed by MRSA infection September of 2014.  3.  Hypertension.  4.  Arthritis.  5.  Chronic anemia.   PAST SURGICAL HISTORY:  1.  Colectomy.  2.  Left wrist surgery.  3.  Right hip surgery.  4.  Left hip ORIF.   SOCIAL HISTORY: She lives at home with her daughter. She does not smoke or drink. She was able to walk with a cane but is much more debilitated now.   FAMILY HISTORY: Significant for CHF in mother and father.   ALLERGIES: SHE IS ALLERGIC TO ASPIRIN WHICH CAUSES ANGIOEDEMA.   ANTIBIOTICS SINCE ADMISSION: Include:  Levofloxacin, begun September 19th, vancomycin with 2 doses given September 19, 20 and  21st, Zosyn given September 19, 20, and 21st. She has also been started on Augmentin 500 mg twice a day.  REVIEW OF SYSTEMS: Eleven systems reviewed and are negative, except as per HPI.  PHYSICAL EXAMINATION: VITAL SIGNS: T-max over the last 24 hours is 98.5, pulse 91, blood pressure 107/68, respirations 22 and sat 95% on 2 liters.  GENERAL: She is cachectic, lying in bed. She is quite depressed. She is alert and oriented.  HEENT: Pupils equal, round and reactive to light and accommodation. Extraocular movements are intact. Sclerae anicteric. Oropharynx is clear.  NECK: Supple. She has no anterior cervical, posterior cervical or supraclavicular lymphadenopathy.  HEART: Regular.  LUNGS: Clear to auscultation bilaterally.  VASCULAR: She has in the right upper chest a Port-A-Cath which is within normal limits.  ABDOMEN: Soft, mildly distended, nontender. No hepatosplenomegaly.  EXTREMITIES: She has in 1 to 2+ bilateral pitting edema.  SKIN: On her left hip, I examined her incision. There is a small opening proximally, but there is no drainage. It is only minimally warm. There is no impressive erythema. There is anasarca all around it. On her back, she does have a T-spine scoliosis with a several centimeter necrotic-appearing decubitus ulcer. I do not see exposed bone, but it is quite close to the vertebrae.  NEUROLOGIC: She is alert and oriented x 3.   LABORATORY AND DIAGNOSTICS: White count is 11.5 down from 14.1 at admission, hemoglobin 10.4 and platelets  262. Blood cultures x 2 are negative. Urinalysis was negative. Renal function: Creatinine 0.56, albumin is quite low at 1.2, alkaline phosphatase is elevated at 174.   Imaging: Chest x-ray done September 28th showed small right pleural effusion and a left pleural effusion. There is some right interstitial alveolar space opacities which may reflect asymmetric edema versus pneumonitis. There is no pneumothorax.   Follow-up x-ray on the 21st  suggests small right effusion. There is also a 7 mm left upper lobe pulmonary nodule.  Prior micro data from last admission includes a swab, unclear where of, that grew MRSA sensitive to clindamycin, gentamicin, vancomycin, linezolid, tetracycline and Bactrim. It was intermediate to levofloxacin. There was also an aerobic gram-positive rod with no further ID. I believe this was done from the left hip.   IMPRESSION: An 81 year old with likely end-stage colon cancer, profound cachexia and severe malnutrition with an albumin of 1.2 admitted with subjective fevers, although the daughter reports it up to 101, as well as leukocytosis. She is clinically stable now after antibiotics. Her white blood count has come down to 11.   Most likely source of her leukocytosis and fever is her T-spine decub. This is somewhat necrotic-appearing. There is not any obvious bone exposed. There is some mild drainage. Her left hip appears relatively stable, and I do not see evidence of active infection there. Other possibilities would include pneumonia, although she has no symptoms at this point of this. Line infection could also be a possibility with the Port-A-Cath, although blood cultures are negative.   RECOMMENDATIONS:  1.  At this point, I suggest we swab the spine for culture to ensure there is no resistant organisms. Given her recent methicillin-resistant Staphylococcus aureus, I would suggest we switch her to Bactrim empirically. We could also continue her on Augmentin which she was just started on, although we may be able to discontinue that depending on the cultures from the wound.  2.  I agree with palliative care evaluation. We can tailor antibiotics to oral treatment over the duration if she makes a decision to be palliative care.    Thank you for the consult. I will be glad to follow with you.  ____________________________ Cheral Marker. Ola Spurr, MD dpf:sb D: 08/18/2013 16:05:28 ET T: 08/18/2013 16:49:12  ET JOB#: [id number]  cc: Cheral Marker. Ola Spurr, MD, <Dictator> Duey Liller Ola Spurr MD ELECTRONICALLY SIGNED 08/25/2013 21:33

## 2015-03-19 NOTE — Discharge Summary (Signed)
PATIENT NAME:  Jamie Fernandez, RANDO MR#:  [id number] DATE OF BIRTH:  [birth year]  DATE OF ADMISSION:  06/22/2013 DATE OF DISCHARGE:  06/23/2013  PRIMARY CARE PHYSICIAN:  Delight Hoh, MD  DISCHARGE DIAGNOSES: 1.  Acute diastolic heart failure, well compensated at this time, with recent echo showing ejection fraction of 55%, on low-dose beta blocker. She is allergic to aspirin, which was not given. 2.  Anemia of chronic disease due to chemotherapy with underlying colon cancer. Hemoglobin is stable. No gross bleeding.  3.  Colon cancer. Underwent chemo at [facility] with Dr. Grayland Ormond.  4.  Leukocytosis, improving, could be due to cellulitis. She is afebrile. can also be due to Neupogen.  5. severe/thrid degree malnutrition: likley due to chronic poor nutrition 6. lt hip area cellulitis with pustules at staples sites: mainly around every staple site, (removed last saturday) with minimal erythema and tenderness - will d/c on abx with close f/up with ortho (Dr Marry Guan)  SECONDARY DIAGNOSES: 1.  Colon cancer status post chemotherapy. 2.  Hypertension.  3.  Arthritis. 4.  Anemia of chronic disease.   CONSULTATIONS: None.   PROCEDURES AND RADIOLOGY: Chest x-ray on the 26th of July showed possible COPD and small effusion. Hiatal hernia present.   MAJOR LABORATORY PANEL: UA on admission was negative.  Blood cultures x 2 were negative on admission. Urine culture was negative.   HISTORY AND SHORT HOSPITAL COURSE: The patient is an 81 year old female with the above-mentioned medical problems who was admitted for new onset CHF. Please see Dr. Graciela Husbands dictated history and physical for further details. The patient was diuresed with IV Lasix and was feeling much better.  She was switched over to oral Lasix and seems close to her baseline. Her 3 sets of enzymes are negative. Her TSH was within normal limits.  She is feeling much better and is being discharged back to [facility] in stable condition.  The patient was about 1.3 liters negative since admission. She was found to have lt hip cellulitis/small abscesses around her staples site on surgical incision for which she will be d/c on abx and close f/up with Dr Marry Guan (I've dicussed this with pt, her daughter and Dr Marry Guan).  PHYSICAL EXAMINATION: VITAL SIGNS: On the date of discharge, her vital signs were as follows: Temperature 98.1, heart rate 86 per minute, respirations 18 per minute, blood pressure 131-67 mmHg. She is saturating 96 to 98% on room air.  CARDIOVASCULAR: S1, S2 normal. No murmurs, rubs or gallop.  LUNGS: Clear to auscultation bilaterally. No wheezing, rales, rhonchi or crepitation.  ABDOMEN: Soft, benign.  NEUROLOGIC: Nonfocal examination. All other physical examination remained at baseline.  Lt Hip wound: scatterred pustules mainly at the staples site with erythema, tenderness around surgical incision. no deep tenderness. steristrip in place.  DISCHARGE MEDICATIONS: 1.  Alprazolam 0.25 mg p.o. b.i.d. as needed.  2.  Protonix 40 mg p.o. daily.  3.  Potassium chloride 20 mEq p.o. b.i.d.  4.  Tylenol 1000 mg p.o. every 6 hours as needed.  5.  Viactiv soft calcium chews once daily. 6.  Zinc 50 mg p.o. daily. 7.  Magnesium hydroxide 8% oral solution 30 mL p.o. b.i.d. as needed.  8.  Norco 5/325 mg 1 to 2 tablets every 4 to 6 hours as needed.  9.  Zofran 4 mg p.o. every 6 hours as needed.  10.  Bisacodyl 10 mg rectal suppository rectally every 6 hours as needed.  11.  Ethyl chloride topical spray to port  site once a day as needed.  12.  Lisinopril 40 mg p.o. daily.  13.  Vitamin D3 1000 international units once daily.  14.  Vitamin C 1 tablet p.o. b.i.d.  15.  Pravastatin 20 mg p.o. at bedtime.  16.  Metoprolol 12.5 mg p.o. every 12 hours.  17.  Lasix 20 mg p.o. daily.  18.  Lasix 40 mg p.o. b.i.d. as needed for shortness of breath and/or increasing swelling. 19.  Keflex 500 mg po q.i.d. for 10 days  DISCHARGE DIET:  Low sodium.   DISCHARGE ACTIVITY: As tolerated.   DISCHARGE INSTRUCTIONS AND FOLLOW-UP:  The patient was instructed to follow up with her primary care physician, Dr. Alyssa Grove, in 1 to 2 weeks. She will need follow-up with cardiology, Dr. Rockey Situ, in 2 to 4 weeks. F/up with Dr Marry Guan by end of this week or the latest early next week to evaluate her hip wound.  TOTAL TIME DISCHARGING THIS PATIENT: 55 minutes.   ____________________________ Lucina Mellow. Manuella Ghazi, MD vss:sb D: 06/23/2013 09:40:42 ET T: 06/23/2013 09:59:54 ET JOB#: [id number]  cc: Marysue Fait S. Manuella Ghazi, MD, <Dictator> Kathlene November. Grayland Ormond, MD Minna Merritts, MD Remer Macho MD ELECTRONICALLY SIGNED 06/25/2013 9:44

## 2015-03-19 NOTE — Consult Note (Signed)
PATIENT NAME:  Jamie Fernandez, Jamie Fernandez MR#:  [id number] DATE OF BIRTH:  1928/03/06  DATE OF CONSULTATION:  01/14/2013  REFERRING PHYSICIAN:   CONSULTING PHYSICIAN:  Loreli Dollar, MD  HISTORY OF PRESENT ILLNESS:  This 81 year old female accompanied by her daughter and son came in emergently to the hospital and was referred for general surgery consultation with a chief complaint of abdominal pain.  She reports that in recent weeks has been having some vague lower abdominal diffuse pain which has just been mild and had in the last two weeks some anorexia, occasional slight nausea, no vomiting, no chills or fever.  Some minimal amount of weight loss in recent weeks.  She reports she had been taking some solid foods, but just generally poor appetite, had recently been moving her bowels satisfactorily, but last bowel movement was two days ago.  She has not passed any flatus today.  Notes that this morning she had increased diffuse lower abdominal pain some of which was crampy with wave-like pains, but not completely resolving.  She has taken some hydrocodone with some relief and reports some improvement during the time of surgical consultation.   PAST MEDICAL HISTORY:  Does include a history of carcinoma of the ascending colon.  She had surgery on 03/21/2011.  She had a large, approximately 6 cm mass at the ascending colon which was adherent to the abdominal sidewall and also had a loop of ileum which is densely adherent to the mass.  She had a right colectomy and a re-segment of this separate segment of ileum with primary anastomosis.  She had extensive intraperitoneal implants.  In addition, she had an omentectomy.  Pathology demonstrated invasive mucinous adenocarcinoma.  The tumor size was 7.4 cm.  There were also other tumor deposits found.  There was no metastatic cancer found in 20 regional mesenteric lymph nodes.  She also had a left-sided pelvic mass which appeared to be associated with the uterus and left  adnexa which Dr. Claiborne Rigg saw her in consultation.  Since then, she has undergone chemotherapy, does have a Port-A-Cath, has been under the care of Dr. Grayland Ormond and most recent course of chemotherapy was completed approximately December 27th.   Other past medical history does include:   1.  A large sliding-type hiatus hernia which also did have findings of small bowel and herniated into the chest.  2.  History of anemia.  3.  History of hypertension.   4.  She reports no history of heart disease or lung disease or hepatitis.  5.  Right hip fracture and pinning.  MEDICATIONS:  Furosemide 40 mg daily, hydralazine 50 mg 2 times per day, lisinopril 40 mg daily, metoclopramide 10 mg 3 times a day, metoprolol 25 mg 2 times per day, Norco as needed, ondansetron 4 mg q. 6 hours as needed for nausea, pantoprazole 40 mg daily, potassium chloride 20 mEq 2 times per day, prochlorperazine 10 mg every 8 hours as needed for nausea, Tylenol 325 mg as needed for pain, vitamins.   DRUG ALLERGIES:  To ASPIRIN.   SOCIAL HISTORY:  Accompanied by her daughter and son.  Does not smoke.  Does not drink any alcohol.   FAMILY HISTORY:  Significant for heart disease.  There is no history of colon or rectal cancer in the family.   REVIEW OF SYSTEMS:   She reports no recent acute illness such as cough, cold or sore throat.  No difficulties with her vision, has had cataract surgery.  She reports  no swollen glands.  No difficulty swallowing.  No heartburn.  No chest pains.  No difficulty breathing.  Has felt somewhat fatigued, does do a limited amount of walking.  Does not need any assistance with cane or walker.  She reports she has been voiding satisfactorily.  Had been moving her bowels satisfactorily until two days ago.  She reports no ankle edema.  Her review of systems otherwise negative.   PHYSICAL EXAMINATION: VITAL SIGNS:  Temperature is 97.3, pulse 76, respirations 20, blood pressure 179/79, pulse ox 97%.   Height is 5 feet 3.5, weight 92 pounds.  BMI of 16.2.  Very asthenic.  SKIN:  Warm and dry.  HEENT:  Pupils with evidence of iridectomy on the right.  Pupils are reactive to light.  Extraocular movements are intact.  Sclerae clear, palpebral conjunctivae, slightly pale.  Pharynx clear.  NECK:  With no palpable mass.  LUNGS:  Lung sounds were clear.  HEART:  Regular rhythm, S1, S2.  Does have Port-A-Cath which is accessed in the right subclavian area.  ABDOMEN:  Does not appear to be distended.  There is minimal degree of tympany in the hypogastrium.  There is a long midline lower abdominal incision which appears to be well-healed.  There is some moderate firmness in the right mid abdomen.  There is no hepatomegaly.  Minimal degree of tenderness.  RECTAL:  Exam demonstrates a large area of firmness which is in the pelvis, could possibly be a left adnexal mass or uterus.  There was no distinct palpable rectal mass.  Sphincter tone is good.  EXTREMITIES:  With no deep pedal edema.  NEUROLOGIC:  Awake, alert, oriented, moving all extremities.   CLINICAL DATA:  Her blood sugar is 108, creatinine 0.86, sodium 131, chloride 95.  Liver panel normal.  CBC with a hemoglobin of 12.8, platelet count 264,000.    I reviewed plain x-rays which do demonstrate a hiatus hernia which appears to be somewhat large.  There are a number of loops of small bowel which appear to be distended.  There is minimal gas if any in the colon and rectum.  There is evidence of pinning of her right hip.   IMPRESSION:   1.  Partial small bowel obstruction.  2.  History of carcinoma of the ascending colon with carcinomatosis.   3.  Cystic complex right adnexal mass.  4.  Left adnexal mass which is more solid.   RECOMMENDATIONS:  Agree with current care.  Keep her nothing by mouth, IV fluids.  I did encourage some walking.  I discussed potential role for nasogastric suction.   I have discussed potential role for surgery in case the  symptoms of bowel obstruction do not resolve.  There could be a role for laparotomy to correct small bowel obstruction.   We will plan to follow.  Anticipate follow-up x-rays.  Could be a role for CT scan possibly with oral contrast.  We will follow.        ____________________________ Lenna Sciara. Rochel Brome, MD jws:ea D: 01/14/2013 19:36:49 ET T: 01/14/2013 23:23:37 ET JOB#: [id number]  cc: Loreli Dollar, MD, <Dictator> Loreli Dollar MD ELECTRONICALLY SIGNED 01/15/2013 20:49

## 2015-03-19 NOTE — Discharge Summary (Signed)
PATIENT NAME:  Jamie Fernandez, EHRLER MR#:  [id number] DATE OF BIRTH:  May 03, 1928  DATE OF ADMISSION:  07/28/2013 DATE OF DISCHARGE:  07/31/2013  HISTORY OF PRESENT ILLNESS: For a detailed note, please take a look at the history and physical done on admission by Dr. Lavetta Nielsen.   DIAGNOSES AT DISCHARGE: As follows: 1. Left hip postoperative wound infection. Left hip methicillin-resistant Staphylococcus aureus infection.  2. Stage IV colorectal cancer.  4. Hypertension.  5. Hyperlipidemia.  6. Gastroesophageal reflux disease.   DIET: The patient was being discharged on a regular diet.   ACTIVITY: As tolerated.   FOLLOWUP:  1. Follow up with Dr. Grayland Ormond in the next 1 to 2 weeks.  2. Also follow up with Dr. Marry Guan from orthopedics in next 1 to 2 weeks.   DISPOSITION: The patient was discharged home with home health physical therapy and nursing services.   CODE STATUS: The patient is a DNI/DNR.   DISCHARGE MEDICATIONS:  1. Xanax 0.25 mg b.i.d. as needed 2. Protonix 40 mg daily. 3. Potassium 20 mEq b.i.d. 4. Tylenol 500 mg 2 tabs q.6 hours as needed. 5. Pravachol 20 mg daily. 6. Lasix 40 mg 1 tab b.i.d. as needed. 7. Reglan 10 mg q.i.d. as needed.  8. Nystatin swish and swallow. 9. Tylenol with hydrocodone 5/325 1 tab q.4 hours as needed.  10. Bactrim 1 tab b.i.d. for 7 days. 11. Metoprolol 12.5 mg b.i.d.   CONSULTANTS DURING THE HOSPITAL COURSE: Dr. Hessie Knows and Dr. Dorise Hiss from orthopedics.   PERTINENT STUDIES DONE DURING THE HOSPITAL COURSE: As follows:  1. A chest x-ray done on admission showing a Port-A-Cath tip in the superior vena cava. There is ill-defined increased density in the right infrahilar region on the right with diffuse interstitial thickening on the right. This suggests underlying right-sided pneumonia versus pneumonitis with effusion. Possibility of underlying malignancy is not excluded. The left lung is clear. The bones are osteopenic.  2. A blood culture is  noted to be negative.  3. Wound cultures on August 29th from the left hip positive for MRSA.   HOSPITAL COURSE: This is a 81 year old female with multiple medical problems, as mentioned above, who presented to the hospital on 07/28/2013 secondary to a fever and also lethargy and noted to have a positive wound culture from her recent left hip surgical site.   1. Fever. The most likely source of the patient's fever was the left hip postoperative wound infection. The patient apparently had a left hip replacement in July of this year. She has been followed by the wound clinic for a stage III to IV lower back/decubitus ulcer. They noticed that the patient had some drainage from her left hip, so they sent it off for culture. It was positive for MRSA. This was likely the source of the patient's fever. The patient was started on IV vancomycin when she was admitted to the hospital. An orthopedic consult was obtained to further evaluate her wound. After being treated with IV vancomycin, the patient's fevers have improved. She has been afebrile now for 48 hours. As far as her wound is concerned, the patient was seen by orthopedics, and they did not think that the patient needed any drainage. They placed a wound VAC on the left hip. She presently is being discharged on that and followup with orthopedics as an outpatient. She was eventually switched over from IV vancomycin to oral Bactrim, which she will continue for the next 7 days. 2. Postoperative left hip wound  infection. This was likely the source of the patient's fever, as mentioned. The wound culture grew out MRSA. She was initially treated with IV vancomycin, switched over eventually to oral Bactrim, which she will take for the next 7 days. She was seen by orthopedics. She had a wound VAC placed, which she will home with, and follow up with orthopedics coming up in the next week or so to get the wound VAC removed. She will also continue followup with the wound  center for her sacral decubitus and her left hip wound. 3. Leukocytosis. This was likely secondary to the postoperative infection. It has since then improved and resolved with IV antibiotic therapy.  4. Hypertension. The patient's blood pressure remained on the low side. She was on multiple antihypertensives prior to coming in. At this point, she is just being discharged on low-dose metoprolol.  5. Hyperlipidemia. The patient was maintained on her Pravachol. She will resume that. 6. Gastroesophageal reflux disease. The patient was maintained on her Protonix, and she will also resume that.  7. Stage IV colorectal cancer. The patient is followed by Dr. Grayland Ormond. She will continue followup with him as an outpatient.   CODE STATUS: The patient is a DNI/DNR.   TIME SPENT ON DISCHARGE: 40 minutes.   ____________________________ Belia Heman. Verdell Carmine, MD vjs:OSi D: 08/01/2013 08:18:12 ET T: 08/01/2013 08:55:37 ET JOB#: [id number]  cc: Belia Heman. Verdell Carmine, MD, <Dictator> Kathlene November. Grayland Ormond, MD Laurice Record. Holley Bouche., MD Henreitta Leber MD ELECTRONICALLY SIGNED 08/09/2013 15:11

## 2015-03-19 NOTE — H&P (Signed)
PATIENT NAME:  Jamie Fernandez, Jamie Fernandez MR#:  [id number] DATE OF BIRTH:  15-Sep-1928  DATE OF ADMISSION:  07/28/2013  PCP/ONCOLOGIST:  Dr. Grayland Ormond   REFERRING PHYSICIAN: Dr. Reita Cliche.    HISTORY OF PRESENT ILLNESS:  Jamie Fernandez is an 81 year old Caucasian female with past medical history stage IVB adenocarcinoma of the colon on active chemotherapy last given in July, hypertension, chronic anemia last transfused approximately one week ago and history of open reduction, internal fixation of left femur secondary to mechanical fall and fracture in July, who is presenting with one day duration of fever. She notes having a fever of 102.2 degrees Fahrenheit at home, the day of admission. This was associated to three days and worsening fatigue and generalized weakness. She noticed chronic drainage of yellowish discharge from her surgical site. Also notes surrounding erythema, but denies any increased pain at the site. She denies any chills or further symptoms including cough, shortness of breath and dysuria.   REVIEW OF SYSTEMS:  CONSTITUTIONAL:  Positive for fever and weakness. Denies any changes in weight.  EYES:  She denies any vision changes or eye redness.  ENT: Denies any hearing loss or ear pain or discharge, dysphagia.  CARDIOVASCULAR: Denies chest pain or palpitations.  RESPIRATORY: Denies any cough or shortness of breath.  GASTROINTESTINAL:  Denies nausea, vomiting, diarrhea.  GENITOURINARY: Denies any dysuria or increased frequency.  ENDOCRINE: Denies any nocturia  or thyroid problems.  HEMATOLOGIC/LYMPHATIC:  Denies bruising or bleeding. She, however, does mention anemia.  SKIN:  She denotes having a sacral decubitus ulcer.  MUSCULOSKELETAL: Denies any pain or swelling.  NEUROLOGIC:  Denies any paralysis or paresthesias.  PSYCHIATRIC:  Denies any anxiety, depression. ALLERGY/IMMUNOLOGY:  No active symptoms.  Otherwise, full review of systems performed remains negative.   PAST MEDICAL HISTORY:  Stage  IV B adenocarcinoma of the colon on chemotherapy,  last given in July by Dr. Grayland Ormond, history of a left hip fracture status post ORIF in July, hypertension, arthritis, chronic anemia.   PAST SURGICAL HISTORY:  Colectomy, left wrist surgery, right hip surgery and left hip ORIF after a fracture.   SOCIAL HISTORY:  She currently lives at home with her husband as well as daughter who is a  caregiver. Denies any alcohol, tobacco or drug usage. She states on a normal day she is fully functional and independent for activities of daily living.   FAMILY HISTORY: Significant for congestive heart failure in both mother and father.   ALLERGIES: TO ASPIRIN WHICH CAUSES ANGIOEDEMA AND SWELLING.   HOME MEDICATIONS:  Include alprazolam 0.25 mg p.o. b.i.d. as needed for anxiety, bisacodyl 10 mg rectal suppository q. 6 hours as needed for constipation, Lasix 40 mg p.o., b.i.d. as needed for edema, magnesium hydroxide, 8% suspension orally b.i.d. as needed for constipation, metoprolol 25 mg p.o., b.i.d., Norco 5/325, 1 to 2 tabs every 4 to 6 hours as needed for severe pain, Zofran 4 mg p.o. q. 6 hours as needed for nausea, pantoprazole 40 mg p.o. daily, potassium 20 mEq p.o. b.i.d., pravastatin 20 mg p.o. at bedtime, Tylenol 500, 2 tabs q. 6 hours needed for pain, Viactiv soft, calcium chews, vitamin C 1 tab 2 times daily, vitamin D3 1000 international units daily, zinc 50 mg p.o. daily.   PHYSICAL EXAM:  VITAL SIGNS:  Temperature 98.9, this is after two doses of Tylenol at home,  pulse 82, respirations 18, blood pressure 97/43, saturating 96% on room air. Weight is 40.8 kg exam GENERAL:  No acute distress. However,  she appears weak and frail, awake, alert and oriented x 3.  HEENT:  Normocephalic, atraumatic. Extraocular muscles intact. Pupils equal round and reactive to light and accommodation. Dry mucosal membranes.  CARDIOVASCULAR:  S1, S2, regular rate and rhythm. No murmurs, rubs or gallops. She has a port that is  accessed on her right chest.  PULMONARY:  Clear to auscultation bilaterally without wheezes, rubs, or rhonchi.  ABDOMEN:  Soft, nontender, nondistended, positive bowel sounds.  EXTREMITIES:  Reveal no cyanosis, edema or clubbing.  SKIN:  There is an approximately 10 cm healed incision over her left hip with multiple areas of where the wound is dehisced. The largest being approximately 2 cm in the upper portion. This is open with and yellow and dark colored drainage, which is not foul smelling. She also has a sacral decubitus ulcer, which would be the stage I to II on her back measuring approximately 4 cm. NEUROLOGIC:  Cranial nerves II through XII intact. No gross neurological deficits.   LABORATORY DATA:  Sodium 132, potassium 4, chloride 101, bicarbonate 25, BUN 12, creatinine 0.66, glucose 98, WBC 12.3, hemoglobin 9.6, MCV 92, RDW 17.2, platelets 346. Urinalysis negative for signs of infection.   ASSESSMENT AND PLAN:  This is an 81 year old Caucasian female with history of stage IVB adenocarcinoma of the colon on active chemotherapy, last in July, hypertension, anemia, and history of open reduction internal fixation of the left femur secondary to mechanical fall and fracture,  is presenting for 1 day duration of fever. She denotes having a fever of 102 degrees Fahrenheit at home associated with 2 to 3 days of worsening fatigue, generalized weakness, as well as chronic discharged from her surgical site.  1.  Postoperative wound infection, MRSA positive from the wound clinic. She has been given vancomycin with pharmacy to dose. Orthopedics has been consulted. They know her well from her previous visits as well as clinic regarding her wound.  She will likely advanced imaging of her hip and a potential possibility of wound debridement.  2.  Hyponatremia. She is on diuretic therapy for edema. Hold Lasix,  provide gentle IV hydration 3.  Stage IV B colon adenocarcinoma. 4. Hypertension. Decrease her beta  blocker given her relative hypotension.  5.  Chronic anemia. No indication for transfusion at this time.  6.  Gastroesophageal reflux disease.  She is on pantoprazole.   CODE STATUS:  The patient is a full code.   TIME SPENT: 45 minutes    ____________________________ Aaron Mose. Malcomb Gangemi, MD dkh:cc D: 07/28/2013 23:39:42 ET T: 07/29/2013 00:36:31 ET JOB#: [id number]  cc: Aaron Mose. Melana Hingle, MD, <Dictator> Enoch Moffa Woodfin Ganja MD ELECTRONICALLY SIGNED 07/29/2013 1:25

## 2015-03-19 NOTE — Consult Note (Signed)
General Aspect 81 year old female with history of carcinoma of the ascending colon s/p surgery on 03/21/2011 presenting with abdominal pain, bowel obstruction. She was started on bowel rest, placed on NGT suction with no relief of her sx. Today, she had atrial fib with RVR, rates up to 180 bpm, last 2 hours. Cardiology was  consulted for atrial fibwith RVR.  Lopressor was given as BP was low. 2.5 mg x 3 was given with improvement of her sx. She converted back to NSR.   Currently feels well with no complaints apart from mild ABD pain. She denies any triggers, pain, N/V prior to arrhythmia.    in recent weeks has been having some lower abdominal pain. two weeks of anorexia,  slight nausea, no vomiting, no chills or fever.  Some weight loss in recent weeks.  She has not passed any flatus.   Present Illness PAST MEDICAL HISTORY:   -- history of carcinoma of the ascending colon.  --surgery on 03/21/2011.   large 6 cm mass at the ascending colon which was adherent to the abdominal sidewall and also had a loop of ileum which is densely adherent to the mass.  --right colectomy and a re-segment of this separate segment of ileum with primary anastomosis.  --extensive intraperitoneal implants. s/p omentectomy.   --Pathology demonstrated invasive mucinous adenocarcinoma.  The tumor size was 7.4 cm.  --left-sided pelvic mass which appeared to be associated with the uterus and left adnexa which Dr. Claiborne Rigg saw her in consultation.   -- she has undergone chemotherapy  DRUG ALLERGIES:  To ASPIRIN.   SOCIAL HISTORY:  Accompanied by her daughter and son.  Does not smoke.  Does not drink any alcohol.   FAMILY HISTORY:  Significant for heart disease.  There is no history of colon or rectal cancer in the family.   Physical Exam:  GEN well developed, well nourished, thin   HEENT pink conjunctivae   NECK supple   RESP normal resp effort  clear BS   CARD Regular rate and rhythm  No murmur  ectopy    ABD positive tenderness  soft   LYMPH negative neck   EXTR negative edema   SKIN normal to palpation   NEURO motor/sensory function intact   PSYCH alert, A+O to time, place, person, good insight   Review of Systems:  Subjective/Chief Complaint ABD pain     Hypertension:    colon cancer:    Arthritis:    anemia:    denies:    colectomy:    Wrist Surgery - Left:    Hip Surgery - Right:        Admit Diagnosis:   COLON CA SBO PAIN NAUSEA: Onset Date: 16-Jan-2013, Status: Active, Description: COLON CA SBO PAIN NAUSEA  Home Medications: Medication Instructions Status  ondansetron 4 mg oral tablet 1 tab(s) orally every 6 hours as needed for nausea Active  prochlorperazine 10 mg oral tablet 1 tab(s) orally every 8 hours, As Needed for nausea Active  furosemide 40 mg tablet 1 tab(s) orally once a day x 30 days, As Needed swelling Active  pantoprazole 40 mg delayed release tablet 1 tab(s) orally once a day x 30 days Active  potassium chloride 20 mEq tablet, extended release 1 tab(s) orally 2 times a day x 30 days Active  metoprolol 25 mg tablet 1 tab(s) orally 2 times a day x 30 days Active  lisinopril 40 mg tablet 1 tab(s) orally once a day x 30 days Active  Vitamin  D3 1000 intl units oral tablet 1 tab(s) orally once a day Active  Tylenol 325 mg oral tablet 2 tab(s) orally every 4 hours, As Needed- for Pain  Active  Viactiv Multi-Vitamin oral tablet, chewable 1 tab(s) orally once a day Active  metoclopramide 10 mg oral tablet 1 tab(s) orally 3 times a day (before meals) Active  Norco 5 mg-325 mg oral tablet 1 to 2 tab(s) orally every 6- 8 hours, As Needed- for Pain  Active  hydrALAZINE 50 mg oral tablet 1 tab(s) orally 2 times a day Active   Lab Results:  Routine Chem:  19-Feb-14 04:27   Glucose, Serum 86  BUN 8  Creatinine (comp)  0.49  Sodium, Serum  135  Potassium, Serum 3.9  Chloride, Serum 101  CO2, Serum 21  Calcium (Total), Serum  7.9  Anion Gap 13   Osmolality (calc) 268  eGFR (African American) >60  eGFR (Non-African American) >60 (eGFR values <56m/min/1.73 m2 may be an indication of chronic kidney disease (CKD). Calculated eGFR is useful in patients with stable renal function. The eGFR calculation will not be reliable in acutely ill patients when serum creatinine is changing rapidly. It is not useful in  patients on dialysis. The eGFR calculation may not be applicable to patients at the low and high extremes of body sizes, pregnant women, and vegetarians.)  Routine Hem:  19-Feb-14 04:27   WBC (CBC) 4.1  RBC (CBC)  3.56  Hemoglobin (CBC)  10.5  Hematocrit (CBC)  31.8  Platelet Count (CBC) 228  MCV 89  MCH 29.6  MCHC 33.1  RDW  15.7  Neutrophil % 72.6  Lymphocyte % 16.5  Monocyte % 10.1  Eosinophil % 0.0  Basophil % 0.8  Neutrophil # 3.0  Lymphocyte #  0.7  Monocyte # 0.4  Eosinophil # 0.0  Basophil # 0.0 (Result(s) reported on 15 Jan 2013 at 06:40AM.)   EKG:  Interpretation EKG shows atrial fib with nonspecific ST ABN, rate 180 bpm   Radiology Results: XRay:    18-Feb-14 22:48, Chest Portable Single View  Chest Portable Single View   REASON FOR EXAM:    to check NGT placement  COMMENTS:       PROCEDURE: DXR - DXR PORTABLE CHEST SINGLE VIEW  - Jan 14 2013 10:48PM     RESULT: Comparison is made to the study of Apr 10, 2011.    The lungs are well-expanded. A Port-A-Cath appliance isin place and   appears unchanged with its tip in the midportion of the SVC. The cardiac   silhouette is top normal in size but stable. A small hiatal hernia is   present. The mediastinum is normal in width. There is an esophagogastric   tube present which appears to be coiled upon itself with the tip of the   tube in the proximal thoracic esophagus, but with the lowermost portion   of the coil lying in the distal esophagus. There are loops of mildly   distended small bowel in the upper abdomen.  IMPRESSION:    1. The  nasogastric tube appears to be coiled upon itself and   repositioning is needed. It does not appear to enter the hiatal   hernia/partially intrathoracic stomach.  2. There is no acute abnormality of the chest otherwise.     Dictation Site: 5        Verified By: DAVID A. JMartinique M.D., MD    Aspirin: Angioedema, Swelling  Vital Signs/Nurse's Notes: **Vital Signs.:   20-Feb-14  14:35  Vital Signs Type Recheck  Pulse Pulse 158  Respirations Respirations 18  Systolic BP Systolic BP 167  Diastolic BP (mmHg) Diastolic BP (mmHg) 69  Mean BP 87  BP Source  if not from Vital Sign Device non-invasive    17:46  Vital Signs Type Q 4hr  Temperature Temperature (F) 98.1  Celsius 36.7  Temperature Source oral  Pulse Pulse 87  Respirations Respirations 18  Systolic BP Systolic BP 425  Diastolic BP (mmHg) Diastolic BP (mmHg) 81  Mean BP 111  Pulse Ox % Pulse Ox % 95  Pulse Ox Activity Level  At rest  Oxygen Delivery Room Air/ 21 %    Impression 81 year old female with history of carcinoma of the ascending colon s/p surgery on 03/21/2011 presenting with abdominal pain, bowel obstruction. She was started on bowel rest, placed on NGT suction with no relief of her sx. Today, she had atrial fib with RVR, rates up to 180 bpm, last 2 hours. Cardiology was  consulted for atrial fibwith RVR.  1) Atrial fib; Rhythm broke with small doses of metoprolol IV. Unable to tolerate po metoprolol -echo pending arrhythmia likely from underlying stressors, small bowel obstruction/pain --Would use small doses of metoprolol for any further atrial fib Could use amiodarone IV if arrhythmia becomes persistent.  2) Preop cardiovastcular eval acceptable risk for surgery from cardiac perspective Medications can be used for arrhythmia, cardioversion if necessary in the periop period.   3)Small bowel obstructoion Surgery planned for tomorrow with Dr. Rochel Brome   Electronic Signatures: Ida Rogue (MD)   (Signed 20-Feb-14 19:57)  Authored: General Aspect/Present Illness, History and Physical Exam, Review of System, Past Medical History, Health Issues, Home Medications, Labs, EKG , Radiology, Allergies, Vital Signs/Nurse's Notes, Impression/Plan   Last Updated: 20-Feb-14 19:57 by Ida Rogue (MD)

## 2015-03-19 NOTE — Consult Note (Signed)
Patient in the OR for a 2nd laparotomy this admission for small bowel obstruction.  She was unable to be evaluated.  Electronic Signatures: Delight Hoh (MD)  (Signed on 07-Mar-14 15:29)  Authored  Last Updated: 07-Mar-14 15:29 by Delight Hoh (MD)

## 2015-03-19 NOTE — Op Note (Signed)
PATIENT NAME:  Jamie Fernandez, Jamie Fernandez MR#:  [id number] DATE OF BIRTH:  1928/05/23  DATE OF PROCEDURE:  01/31/2013  PREOPERATIVE DIAGNOSIS:  Small bowel obstruction, carcinomatosis.  POSTOPERATIVE DIAGNOSIS:  Small bowel obstruction, carcinomatosis.   PROCEDURE:  Laparotomy, extensive lysis of adhesions and enteroenterostomy.   SURGEON:  Rochel Brome, M.D.   ANESTHESIA:  General.   INDICATIONS:  This 81 year old female has history of carcinoma of the ascending colon with abdominal carcinomatosis and right colectomy.  Also has known left adnexal mass and right adnexal cyst, recently had a laparotomy for small bowel obstruction with lysis of adhesions, improved, but later developed recurrent pain and distention and vomiting.  X-rays were consistent with small bowel obstruction and further surgery was recommended for definitive treatment.   DESCRIPTION OF PROCEDURE:  The patient was placed on the operating table in the supine position under general endotracheal anesthesia.  The circulating nurse inserted a Foley urinary catheter with Betadine preparation of the perineum draining a clear yellow urine.  The abdomen was prepared with ChloraPrep, draped in a sterile manner.    There was an incision in the mid aspect of the abdomen in the midline oriented longitudinally.  The sutures were removed and incision was made at the same site beginning with a scalpel and then with blunt dissection, 0 Maxon sutures were removed opening the peritoneal cavity.  There was some ascites which was aspirated.  There were multiple dilated loops of small bowel, multiple adhesions and also some decompressed distal loops of small bowel were identified.  There was a finding of carcinomatosis with extensive tumor implants.  The incision was lengthened proximally and distally for better exposure.  The ligament of Treitz was identified and this portion of small bowel was markedly dilated and this was followed distally finding an area where  there was some twisting of the bowel and extended around to the right side and there were multiple adhesions and multiple tumor implants.  There were tumor implants in all quadrants.  Also identified the old ileocolonic anastomosis, which the bowel site was decompressed.  There were multiple tumor implants in the region of the transverse colon and tumor implants which were innumerable lining the sigmoid colon and extending down into the pelvis.  Tumor implants in the left upper quadrant amounted to what might be more than an ounce of tissue, but was not localized, but scattered.  The adhesions were taken down and I ultimately elected to do an enteroenterostomy between the markedly dilated small bowel and the decompressed small bowel which was terminal ileum, which was leading into the colon.  The two portions of bowel were placed side by side.  The dilated portion of bowel was milked back so that a bowel clamp was placed across it so that the bowel to be opened would not contain fluid.  Two enterotomies were made and the GIA 55 stapler was introduced to begin anastomosis along the antimesenteric borders and the staple line appeared to be hemostatic.  Next, the remaining defect was closed with a TA-60 stapler.  Next, the junction of the staple lines was imbricated with 5-0 Vicryl sutures.  The enteroenterostomy looked good, was widely patent.  I could milk some of the small bowel contents through the anastomosis and into the transverse colon and with subsequent milking of intestinal contents could see that some of it entered into the sigmoid colon.  Following this, I noted that hemostasis was intact.  A small amount of additional ascites was aspirated.  There  was approximately 500 mL of ascites which was aspirated.  Blood loss was very minimal, less than 10 mL.  Next, the abdomen was closed with interrupted 0 Maxon inverted figure-of-eight sutures and skin was closed with clips and dressings were applied with paper  tape.  The patient tolerated the procedure satisfactorily and was prepared for transfer to the recovery room.     ____________________________ Lenna Sciara. Rochel Brome, MD jws:ea D: 01/31/2013 16:08:00 ET T: 02/01/2013 01:39:08 ET JOB#: [id number]  cc: Loreli Dollar, MD, <Dictator> Loreli Dollar MD ELECTRONICALLY SIGNED 02/01/2013 14:48

## 2015-03-19 NOTE — H&P (Signed)
PATIENT NAME:  Jamie Fernandez, Jamie Fernandez MR#:  [id number] DATE OF BIRTH:  May 19, 1928  DATE OF ADMISSION:  08/16/2013  PRIMARY CARE PHYSICIAN:  Dr. Grayland Ormond   REFERRING EMERGENCY ROOM PHYSICIAN:  Dr. Graciella Freer   CHIEF COMPLAINT:  Fever and weakness.   HISTORY OF PRESENT ILLNESS:  An 81 year old female with past medical history of colon cancer and chronic malnutrition, with recent wound infection of the left hip with MRSA, colorectal cancer stage IV, who was admitted from September 1 to September 4 for MRSA infection, and was discharged home with oral Bactrim. She finished taking oral Bactrim on September 13, and is living with her daughter. Was routinely able to walk with a cane, a little weak, but since she finished her antibiotic she started having fever on and off again almost every evening, ranging from 99 degrees Fahrenheit to 101 degrees Fahrenheit. She has become weaker for the last 2 to 3 days, that she is not able to get up and not eating anything for the last 2 days, so daughter decided to bring her to the hospital. On initial workup, she was found having worsening of her white cell count. Her x-ray of the chest looks a little worsening on the right side. Urinalysis is negative, and because of her recent history of MRSA wound infection, she is being admitted for fever of unknown origin.   REVIEW OF SYSTEMS:   CONSTITUTIONAL:  Positive for fever and generalized weakness.  EYES:  No blurring, double vision or weight loss.  ENT:  No tinnitus, ear pain or hearing loss.  RESPIRATORY:  No cough, wheezing, hemoptysis, or shortness of breath.  CARDIOVASCULAR:  No chest pain, orthopnea, edema or arrhythmia.  GASTROINTESTINAL:  No nausea, vomiting, abdominal pain or diarrhea.  GENITOURINARY:  No dysuria, hematuria, or increased frequency.  ENDOCRINE:  No increased sweating, heat or cold intolerance.  SKIN:  No acne or rashes.  MUSCULOSKELETAL: No pain or swelling in the joints.  NEUROLOGICAL: No  numbness, weakness, tremors or headache.  PSYCHIATRIC:  No anxiety, insomnia, bipolar disorder.   PAST MEDICAL HISTORY:  1.  Stage IV-B adenocarcinoma of colon.  2.  History of left hip fracture, status post open reduction/internal fixation in July, followed by wound infection by MRSA in September 2014.  3.  Hypertension.  4.  Arthritis.  5.  Chronic anemia.   PAST SURGICAL HISTORY:  1.  Colectomy. 2.  Left wrist surgery.  3.  Right hip surgery.  4.  Left hip open reduction/internal fixation after a fracture.   SOCIAL HISTORY:  Currently lives at home with daughter, who is caregiver also. Denies alcohol, smoking, or drug use. She was able to walk with a cane, but gradually getting worse, as per daughter.   FAMILY HISTORY:  Significant for congestive heart failure in both mother and father.   ALLERGIES:  ASPIRIN, which causes angioedema.   VITAL SIGNS: In ER, temperature 98.3, pulse 82, respirations 20, blood pressure 96/42, pulse ox 95% on room air.   PHYSICAL EXAMINATION: GENERAL:  The patient is very cachectic, fully alert and oriented. Does not appear in any acute distress.  HEENT: Head and neck atraumatic. Severe muscle wasting present. Oral mucosa moist.  NECK:  Supple. No JVD.  RESPIRATORY:  Bilateral clear and equal air entry.  CARDIOVASCULAR:  S1, S2 present. Regular. No murmur.  ABDOMEN:  Slightly distended, nontender. Bowel sounds present.  SKIN:  No rashes.  LEGS:  Bilateral pitting edema, more on left than right.  NEUROLOGICAL: Power  3/5 to 4/5. Generalized weakness present. On right side of the back thoracic wall, there is an ulcer present which is 2 x 4 cm roughly, and grossly does not appear to be infected.  PSYCHIATRIC:  Does not appear in any acute psychiatric illness at this time.   IMPORTANT LAB RESULTS: Creatinine 0.66, sodium 132, potassium 4.4, calcium 7.4, albumin 1.2. White cell count 14.1, hemoglobin 8.4, platelet count 317. Urinalysis grossly negative.  Chest x-ray portable:  Small right pleural effusion, right lung interstitial and alveolar airspace opacity, asymmetric edema versus pneumonitis, no pneumothorax.   ASSESSMENT AND PLAN: An 81 year old female with past medical history of stage IV colon cancer and hip replacement surgery, with bedsore on right back. Was in hospital from September 1 to September 4, and found having MRSA wound infection on left hip, and discharged home with oral antibiotic. For last 4 to 5 days, she has fever on and off almost every day. Maximum as noticed by daughter 101 degrees Fahrenheit.   1.  Fever of unknown origin. Recent and recurrent infections and in the hospital was MRSA in the wound. Will give broad-spectrum antibiotic. Chest x-ray looks like pleural effusion. Will repeat the chest x-ray tomorrow with AP and lateral to see   with previous x-rays. Will also check stool for C. diff, and Infectious Disease consult for further management. Blood culture has been sent, and will call wound care team for management of her wound.   2.  Hypoalbuminemia, severe malnutrition. Albumin is 1.4. Will give IV albumin and will call dietary consult for further management of these issues. She has stage IV cancer and severely cachectic, so dietitian might help in proper nutrition at home.   3.  Colon cancer stage IV. Dr. Grayland Ormond decided to wait until she gets a little stronger from all her health issues. Currently not a candidate for chemotherapy due to generalized weakness and severe cachexia.  4.  Hypertension. Will continue metoprolol 25 mg b.i.d.   Code status is DNR.  Her healthcare power of attorney is her daughter.   Total time spent on this admission:  50 minutes.     ____________________________ Ceasar Lund Anselm Jungling, MD vgv:mr D: 08/16/2013 17:28:00 ET T: 08/16/2013 19:08:11 ET JOB#: [id number]  cc: Ceasar Lund. Anselm Jungling, MD, <Dictator> Vaughan Basta MD ELECTRONICALLY SIGNED 08/22/2013 18:36

## 2015-03-19 NOTE — Op Note (Signed)
PATIENT NAME:  Jamie Fernandez, Jamie Fernandez MR#:  [id number] DATE OF BIRTH:  1928-10-16  DATE OF PROCEDURE:  06/08/2013  PREOPERATIVE DIAGNOSIS: Left femoral neck fracture.  POSTOPERATIVE DIAGNOSIS: Left femoral neck fracture.   PROCEDURE PERFORMED: Left hip hemiarthroplasty.   SURGEON OF RECORD: Stacy Gardner, MD   ANESTHESIA: General.   ESTIMATED BLOOD LOSS: 100 mL.   COMPLICATIONS: None.   DISPOSITION: Stable to the PACU upon transport.   INDICATIONS FOR PROCEDURE: This is an 81 year old female who fell, sustaining a displaced left femoral neck fracture. She was counseled regarding the risks and benefits of the procedure, the risks to include but limited to bleeding, infection, damage to nerves and/or vessels, fracture-dislocation and the need for additional surgical procedures. The family was also counseled regarding the risks of death undergoing general anesthesia and major surgery. The patient and her family agreed to proceed with surgery after weighing all these risks.   DESCRIPTION OF PROCEDURE: The patient was brought to the operating room and placed on the operating room table in the lateral position with all extremities appropriately padded. The left lower extremity was prepped and draped in sterile fashion. A timeout was then performed for Anesthesia, nursing staff and surgeon of record to confirm surgical site, patient medical record number, date of birth, procedure to be performed, laterality, confirmation that preoperative antibiotics were administered and confirmation that all necessary equipment was in room. Next, a posterior approach to the hip was made with an approximately 12 cm incision. We carried dissection down to the level of the fascia and then sharply splitting the fascia, then blunt dissection down to the level of the piriformis and short external rotators. These were tagged prior to take down off the femur, and the hip capsule was also released at this time and a tag suture was  placed. The femoral neck fracture was now exposed.  A saw was used to freshen up the femoral neck cut, and then the corkscrew was used to remove the head.  The head was sized to a 44, and a 44 trial was trialed in the acetabulum and felt to have good fit. Next, we used the canal finder to begin our entry, followed by our hand reamer to make sure we were in the intramedullary canal and then reamed with the femoral lateralizer to make sure our trajectory was [place] appropriately lateralized. At this point, we sequentially broached starting at a size 1 and broached up to size 4 and had good fit with a size 4. We trialed the size 44 head and felt we had good motion and stability with that size and plan. We also used the calcar planer at this time to make sure our femoral cut was planed appropriately. We then removed our trial broach and placed a size 3 femoral restrictor down the canal. The canal was then irrigated with pulse lavage and epinephrine-soaked sponges placed in the canal while the cement was mixed.  We next placed cement down the canal and then placed our femoral stem. We allowed approximately 17 minutes for the cement to cure before once again trialing our 44 trial head. At this point, we opened our final 44 head component, placed this on the femoral neck, and then tapped twice with the impactor to secure the [facility] taper. We then once again reduced the hip and confirmed both our motion and stability were good. The leg was then placed on a padded Mayo stand to keep it  abducted, and the hip capsule was repaired through  a drill hole for the femur and the greater trochanter as well as through the abductor tendon insertion. The piriformis and short external rotators were also reapproximated to the abductor tendon. The wound was once again irrigated with saline, and the fascia was closed with 0 Vicryl followed by subcuticular 2-0 Vicryl. The skin was closed with staples. An abduction pillow was placed, and a sterile  dressing was also applied over the wound.  The patient was then transferred to the PACU in stable condition.   ____________________________ Claud Kelp, MD tte:cb D: 06/08/2013 14:38:47 ET T: 06/08/2013 19:59:56 ET JOB#: [id number]  cc: Claud Kelp, MD, <Dictator> Claud Kelp MD ELECTRONICALLY SIGNED 06/14/2013 13:03

## 2015-03-19 NOTE — Consult Note (Signed)
Brief Consult Note: Diagnosis: left hip factor.   Patient was seen by consultant.   Consult note dictated.   Recommend to proceed with surgery or procedure.   Comments: ivf after procedue at low rate to prevent fluid overload.  Electronic Signatures: James Ivanoff, Roselie Awkward (MD)  (Signed 12-Jul-14 19:50)  Authored: Brief Consult Note   Last Updated: 12-Jul-14 19:50 by James Ivanoff, Roselie Awkward (MD)

## 2015-03-19 NOTE — Consult Note (Signed)
PATIENT NAME:  Jamie Fernandez, Jamie Fernandez MR#:  [id number] DATE OF BIRTH:  08-02-1928  DATE OF CONSULTATION:  06/07/2013  CONSULTING PHYSICIAN:  [place] Sink, MD  REQUESTING PHYSICIAN:  Dr. Stacy Gardner, Orthopedics  PRIMARY CARE PHYSICIAN: Dr. Delight Hoh, who is also his hematologist-oncologist.  CHIEF COMPLAINT: Status post fall, broken left hip, medicine clearance.  HISTORY OF PRESENT ILLNESS:  Jamie Fernandez a very nice, 81 year old female, who has history of colon cancer, hypertension, on chemotherapy for her colon cancer, who comes with a history of being at home, trying to grab a pillow from around the couch, standing up and falling down to the floor, hitting the left side of her body. The patient was at home with her husband. They called her daughter, her daughter called EMS. She was not able to get up off the floor. When EMS picked her up, she had external rotation of her left lower extremity. She was brought to the ER, evaluated, and she had a hip fracture. She has had a right hip fracture in the past that was replaced, and a left wrist fracture.   The patient states that overall her pain is well-controlled. She denies any active cardiovascular disease. She has been evaluated in the past for atrial fibrillation by Dr. Rockey Situ, who is treating her just for rate control with metoprolol. The patient does not have any active coronary artery disease, no chest pain, no significant shortness of breath, no numbness of the left upper extremity. No nausea, vomiting, diaphoresis with exercise. The patient is not that active anymore, but she gets around pretty well without a cane or walker. The patient had an echocardiogram done back in February after one of her surgeries for her colon cancer, and it showed ejection fraction of 17%, with diastolic dysfunction.   The patient is scheduled to have surgery tomorrow. She has been receiving her third cycle of chemotherapy by Dr. Grayland Ormond. She has  received 1 unit of blood about a month ago, and she received a shot of Neupogen this morning. Her hemoglobin is 8.5 and her white blood cells are 17.7. Previous white blood cells were 5.3, and prior to that they were 2.0. Overall, the patient is doing okay. She has well-controlled pain. We were called to evaluate her for medical clearance. At this moment, the patient is mild to moderate risk for major surgery, though for this type of surgery, which is low risk for cardiovascular complications, the patient could go and get it done without having to do any other major testing, as she has a normal echo and no significant active cardiovascular problems. The patient is cleared for surgery tomorrow morning.  REVIEW OF SYSTEMS:   CONSTITUTIONAL:  No fever, fatigue. No weight loss or weight gain. EYES: No blurry vision, double vision.  EARS, NOSE, THROAT:  No tinnitus. No ear pain, no difficulty swallowing.  RESPIRATORY:  No cough, wheezing, hemoptysis or COPD.  CARDIOVASCULAR:  No chest pain, orthopnea, palpitations or syncope. He has history of chronic edema since he has been receiving chemotherapy, and she is taking Lasix for that.  GASTROINTESTINAL: No nausea, vomiting, abdominal pain, constipation, diarrhea or changes in bowel frequency.  GENITOURINARY:  No dysuria, hematuria, or changes in frequency. Positive mild incontinence.  GYNECOLOGIC:  No breast masses.  ENDOCRINE:  No polyuria, polydipsia, polyphagia, cold or heat intolerance.  HEMATOLOGIC AND LYMPHATIC: No easy bruising. No bleeding. Positive anemia secondary to chemotherapy. Positive for leukopenia, also secondary to chemotherapy status post Neupogen.  SKIN:  No rashes, petechiae or new moles.  MUSCULOSKELETAL: Positive left hip pain.  NEUROLOGIC:  No numbness, tingling. No CVA or TIA.   PSYCHIATRIC:  No significant depression or changes in weight.   PAST MEDICAL HISTORY: 1.  Hypertension.  2.  GERD.  3.  Colon cancer, currently on  chemotherapy.  4.  Anemia of chronic disease due to cancer. 5.  Leukopenia.  6.  History of small bowel obstructions.  7.  History of atrial fibrillation with rapid ventricular response.  8.  Hyperlipidemia.   ALLERGIES:  ASPIRIN, angioedema.   PAST SURGICAL HISTORY POSITIVE FOR: 1.  Two GI surgeons due to adhesions.  2.  Removal of 10 cm of right colon due to colon cancer.  3.  Hemicolectomy.  4.  Wrist fracture open reduction/internal fixation.  5.  Hip fracture, with open reduction/internal fixation of the right hip.   FAMILY HISTORY:  Positive for CHF in both parents. No coronary artery disease. Positive stroke in grandparents. No diabetes.   SOCIAL HISTORY:  The patient has never smoked. She does not drink. She does not use drugs. She lives with her husband and her daughter. Owns her own house. They are retired.   CURRENT MEDICATIONS: Zinc 50 mg once a day, vitamin D3, 1000 mg once a day, vitamin C 500 mg twice daily, vitamins with calcium once a day, Tylenol p.r.n. pain, potassium chloride 20 mEq twice daily, Protonix 40 mg once a day, Norco 5/325 mg every 8 hours as needed for pain, multivitamins once daily, metoprolol succinate 1 two times a day, lisinopril 40 mg once a day, hydralazine 50 mg twice daily, furosemide 40 mg twice daily, alprazolam 0.25 mg twice daily.   PHYSICAL EXAMINATION: VITAL SIGNS: Blood pressure 121/64, pulse is 70, respirations 18, temperature 97.8, pulse ox 95% on room air.  GENERAL: The patient is alert, oriented x 3. No acute distress. No respiratory distress. Hemodynamically stable.  HEENT:  Her pupils are equal, round and reactive. Extraocular movements are intact. Mucosa are moist. Anicteric sclerae. Pink conjunctivae. No oral lesions. No oropharyngeal exudates. Normocephalic, atraumatic.  NECK:  Supple. No JVD. No thyromegaly. No adenopathy. No carotid bruits. No rigidity. No masses.  CARDIOVASCULAR: Irregularly irregular. No murmurs, rubs or gallops  are appreciated. No displacement of PMI. No tenderness to palpation anterior chest wall.  LUNGS:  Clear, without any wheezing or crepitus. No use of accessory muscles.  ABDOMEN:  Soft, nontender, nondistended. No hepatosplenomegaly. No masses. Bowel sounds are positive. No rebound.  GENITAL:  Negative for external lesions.  EXTREMITIES:  No cyanosis or clubbing. Positive +1 edema, which is chronic. There is a musculoskeletal deformity of the left lower extremity, with external rotation of the left lower extremity at the level of the hip. Tenderness to mobilization of the left lower extremity. No significant abrasions or deformities.  NEUROLOGIC:  Cranial nerves II through XII intact. Strength is 5/5 in upper extremities, lower extremities difficult to evaluate due to fracture.  LYMPHATIC:  Negative for lymphadenopathy in neck or supraclavicular area.  VASCULAR:  Good capillary refill less than 3 seconds. Pulses +2 distally.  PSYCHIATRIC:  Negative for agitation. Alert, oriented x 3.  SKIN:  No rashes or petechiae.   RESULTS:  Glucose 86, BUN 17, creatinine 0.96, sodium 131, which is chronically low in the 130s, potassium 3.4, chloride 93. White count 17.7, hemoglobin 8.5, platelets 322. Urinalysis: No signs of a urinary tract infection, only 2 white blood cells, negative nitrites, negative leukocyte esterase.   Chest  x-ray:  Underlying areas of fibrosis, although patchy infiltrate of the right upper lobe is not excluded, patient does not have any cough or fever.   ASSESSMENT AND PLAN:  This is a very nice 81 year old female with history of previously-diagnosed atrial fibrillation back in January, whenever she had her  management of her colon cancer. Possibilities of going to RVR again are there, but we are going to try to keep her from having problems like that.   We are going to continue her beta blocker preoperatively.  She does not have any active coronary artery disease, for what she will be  cleared for surgery. She has mild to moderate risk factor based on her age and comorbidities.   The patient has significant anemia, which is secondary to chronic disease and chemotherapy, and she will be receiving a unit of blood with the surgery tomorrow. The transfusion reasoning is because of her active chemotherapy, low blood levels and surgery that will make her have anemia anyway.   As far as her colon cancer, it is stable. The patient had leukopenia due to chemotherapy as well as anemia, for what she wants to receive Neupogen, and her white count is actually 17,000 now.   The patient looks very good at this moment. She is cleared for surgery.   As far as her blood pressure, continue management with beta blocker.   Deep vein thrombosis prophylaxis with Lovenox after surgical management.   I spent about 45 minutes with this patient getting her history, talking with the family. She does have history of diastolic dysfunction, with an ejection fraction of 55%, for what we have to be just careful with the fluids. No active congestive heart failure.    ____________________________ Iola Sink, MD rsg:mr D: 06/07/2013 20:10:12 ET T: 06/07/2013 22:23:23 ET JOB#: [id number]  cc: Lotsee Sink, MD, <Dictator> Gaurav Baldree America Brown MD ELECTRONICALLY SIGNED 06/08/2013 13:32

## 2015-03-19 NOTE — Consult Note (Signed)
Chief Complaint:  Subjective/Chief Complaint seen for abdominalpain.  patient had bm today after repeat enema, passing flatus.  mild nausea, overall appears more tired.   VITAL SIGNS/ANCILLARY NOTES: **Vital Signs.:   05-Mar-14 14:49  Vital Signs Type Routine  Temperature Temperature (F) 98.1  Celsius 36.7  Temperature Source oral  Pulse Pulse 78  Respirations Respirations 18  Systolic BP Systolic BP 450  Diastolic BP (mmHg) Diastolic BP (mmHg) 71  Mean BP 95  Pulse Ox % Pulse Ox % 94  Pulse Ox Activity Level  At rest  Oxygen Delivery Room Air/ 21 %   Brief Assessment:  Cardiac Regular   Respiratory clear BS   Gastrointestinal details normal mild distension, bs positive, mild diffuse tenderness.   Lab Results: Routine Chem:  05-Mar-14 06:01   Glucose, Serum  128  BUN  6  Creatinine (comp)  0.46  Sodium, Serum  126  Potassium, Serum 3.6  Chloride, Serum  93  CO2, Serum 24  Calcium (Total), Serum  7.7  Anion Gap 9 (Result(s) reported on 29 Jan 2013 at 07:20AM.)  Osmolality (calc) 253   Radiology Results: XRay:    05-Mar-14 08:09, KUB - Kidney Ureter Bladder  KUB - Kidney Ureter Bladder   REASON FOR EXAM:    Assess interval change of ileus.  COMMENTS:   LMP: N/A    PROCEDURE: DXR - DXR KIDNEY URETER BLADDER  - Jan 29 2013  8:09AM     RESULT:     Comparison is made to a prior study dated 01/28/2013.    Air is seen within distended loops of large bowel and mild to moderately   dilated loops of small bowel. When compared to the previous study, there   has been slight decreased conspicuity of the small bowel gas and   otherwise no significant change.    IMPRESSION:  Slight decrease in the small bowel air as described above.     Otherwise, no significant change in the abdomen and findings are again   consistent with an ileus.       Thank you for this opportunity to contribute to the care of your patient.         Verified By: Mikki Santee, M.D., MD    Assessment/Plan:  Assessment/Plan:  Assessment 1) post op ileus in the settign of h/o surgery for colon cancer, peritoneal carcinomatosis, ex lap a little over a week ago for partial sbo.  some improvement today 2) hyponatremia.   Plan 1) continue current.  no new GI recs.   Electronic Signatures: Loistine Simas (MD)  (Signed 05-Mar-14 16:46)  Authored: Chief Complaint, VITAL SIGNS/ANCILLARY NOTES, Brief Assessment, Lab Results, Radiology Results, Assessment/Plan   Last Updated: 05-Mar-14 16:46 by Loistine Simas (MD)

## 2015-03-19 NOTE — Consult Note (Signed)
Asked by nursing to quickly assess a pt in room 103 Jamie Fernandez for tachycardia. Dr Rockey Situ was consulted by oncology.shows AFIB-RVR 140 Bp 112 . Pt c/o mild sob no cp. I tried to call Gollan in the office but he was busy so I helped orderIV for rate until Rockey Situ can come. Goal is Hr < 100 Sbp>100  Electronic Signatures: Lujean Amel D (MD)  (Signed on 20-Feb-14 15:33)  Authored  Last Updated: 20-Feb-14 15:33 by Lujean Amel D (MD)

## 2015-03-19 NOTE — Consult Note (Signed)
Chief Complaint:  Subjective/Chief Complaint seen for abdomina; pain.  NGT in place, with less distension, less abdominal pain.   VITAL SIGNS/ANCILLARY NOTES: **Vital Signs.:   06-Mar-14 13:24  Vital Signs Type Routine  Temperature Temperature (F) 97.3  Celsius 36.2  Temperature Source oral  Pulse Pulse 71  Respirations Respirations 18  Systolic BP Systolic BP 650  Diastolic BP (mmHg) Diastolic BP (mmHg) 67  Mean BP 86  Pulse Ox % Pulse Ox % 92  Pulse Ox Activity Level  At rest  Oxygen Delivery Room Air/ 21 %   Brief Assessment:  Cardiac Regular   Respiratory clear BS   Gastrointestinal details normal Soft  Bowel sounds normal  minimal tenderness, mild/less distension   Lab Results: Hepatic:  06-Mar-14 05:50   Bilirubin, Total 0.3  Bilirubin, Direct 0.1 (Result(s) reported on 30 Jan 2013 at 07:08AM.)  Alkaline Phosphatase 75  SGPT (ALT)  9  SGOT (AST)  14  Total Protein, Serum  4.8  Albumin, Serum  2.1  Routine Chem:  06-Mar-14 05:50   Glucose, Serum  135  BUN  6  Creatinine (comp)  0.57  Sodium, Serum  131  Potassium, Serum 4.1  Chloride, Serum 100  CO2, Serum 26  Calcium (Total), Serum  7.4  Anion Gap  5  Osmolality (calc) 262  eGFR (African American) >60  eGFR (Non-African American) >60 (eGFR values <9m/min/1.73 m2 may be an indication of chronic kidney disease (CKD). Calculated eGFR is useful in patients with stable renal function. The eGFR calculation will not be reliable in acutely ill patients when serum creatinine is changing rapidly. It is not useful in  patients on dialysis. The eGFR calculation may not be applicable to patients at the low and high extremes of body sizes, pregnant women, and vegetarians.)  Routine Coag:  06-Mar-14 05:50   Prothrombin 13.3  INR 1.0 (INR reference interval applies to patients on anticoagulant therapy. A single INR therapeutic range for coumarins is not optimal for all indications; however, the suggested range  for most indications is 2.0 - 3.0. Exceptions to the INR Reference Range may include: Prosthetic heart valves, acute myocardial infarction, prevention of myocardial infarction, and combinations of aspirin and anticoagulant. The need for a higher or lower target INR must be assessed individually. Reference: The Pharmacology and Management of the Vitamin K  antagonists: the seventh ACCP Conference on Antithrombotic and Thrombolytic Therapy. CPTWSF.6812Sept:126 (3suppl): [id number] A HCT value >55% may artifactually increase the PT.  In one study,  the increase was an average of 25%. Reference:  "Effect on Routine and Special Coagulation Testing Values of Citrate Anticoagulant Adjustment in Patients with High HCT Values." American Journal of Clinical Pathology 2006;126:400-405.)  Routine Hem:  06-Mar-14 05:50   WBC (CBC) 6.4  RBC (CBC)  3.71  Hemoglobin (CBC)  10.6  Hematocrit (CBC)  31.9  Platelet Count (CBC) 325  MCV 86  MCH 28.6  MCHC 33.3  RDW  16.0  Neutrophil % 74.0  Lymphocyte % 17.4  Monocyte % 8.0  Eosinophil % 0.2  Basophil % 0.4  Neutrophil # 4.8  Lymphocyte # 1.1  Monocyte # 0.5  Eosinophil # 0.0  Basophil # 0.0 (Result(s) reported on 30 Jan 2013 at 07:47AM.)   Assessment/Plan:  Assessment/Plan:  Assessment 1) ileus, possible recurrent SBO.  some sympton improvement with NGT.  no bm today, no passing flatus.   Plan 1) continue current, no new recs.   Electronic Signatures: SLoistine Simas(MD)  (Signed 06-Mar-14 17:01)  Authored: Chief Complaint, VITAL SIGNS/ANCILLARY NOTES, Brief Assessment, Lab Results, Assessment/Plan   Last Updated: 06-Mar-14 17:01 by Loistine Simas (MD)

## 2015-03-19 NOTE — H&P (Signed)
PATIENT NAME:  Jamie Fernandez, NOTO MR#:  [id number] DATE OF BIRTH:  [birth year]  DATE OF ADMISSION:  06/22/2013   ONCOLOGIST:  Dr. Grayland Ormond.   REFERRING PHYSICIAN:  Dr. Reita Cliche.   CHIEF COMPLAINT:  Low blood pressure.   HISTORY OF PRESENT ILLNESS:  The patient is an 81 year old Caucasian female with a past medical history of colon cancer status post chemotherapy, sees Dr. Grayland Ormond as an outpatient, was recently sent over to rehab after she had a fall and had a left hip fracture which was repaired  on July 12th.  The patient was doing okay, but her blood pressure was found to be low with systolic blood pressure at 90s at rehab center, regarding which the patient was sent over to the ER.  The patient is reporting that she is feeling a little short of breath lately.  She has been taking Lasix 1 to 2 tablets per day recently regarding lower extremity edema.  Denies any chest pain or abdominal pain.  In the ER, patient's blood pressure is normal, pulse oximetry is also normal.  BNP is elevated.  Chest x-ray revealed a right-sided pleural effusion.  The patient has 2 to 3+ pitting edema in the lower extremities.  Recent echocardiogram done for preop clearance in February has revealed an ejection fraction of 55%.  The patient denies any cardiac history.   PAST MEDICAL HISTORY:  Colon cancer getting chemotherapy, recent history of left hip fracture and surgery, hypertension, arthritis, anemia.   PAST SURGICAL HISTORY:  Colectomy, left wrist surgery, right hip surgery and recent left hip surgery after she had a fracture.  ALLERGIES:  The patient is allergic to ASPIRIN, GIVES HER ANGIOEDEMA AND SWELLING.   PSYCHOSOCIAL HISTORY:  Currently residing in a rehab center.  No history of smoking, alcohol or illicit drug usage.   FAMILY HISTORY:  Both mother and father had a history of congestive heart failure.   HOME MEDICATIONS:  Vitamin D3 1 capsule once daily, potassium chloride 20 mEq by mouth 2 times a day,  pantoprazole 40 mg once daily, ondansetron 4 mg q. 6 hours as needed, metoprolol succinate 25 mg 2 times a day, lisinopril 40 mg once daily, Lasix 40 mg 2 times a day, alprazolam 0.25 mg 1 tablet 2 times a day.   REVIEW OF SYSTEMS: CONSTITUTIONAL:  Denies any fever or fatigue.  EYES:  Denies blurry vision, glaucoma.  EARS, NOSE, THROAT:  No epistaxis or discharge.  RESPIRATION:  Denies cough, wheezing.  CARDIOVASCULAR:  No chest pain, but recently experiencing a little short of breath.  Denies palpitations.   GASTROINTESTINAL:  Denies any nausea, vomiting, diarrhea.  GENITOURINARY:  No dysuria or hematuria.  GYNECOLOGIC AND BREAST:  Denies breast mass, vaginal discharge.   ENDOCRINE:  Denies polyuria, nocturia.  HEMATOLOGIC AND LYMPHATIC:  Chronic anemia is present.  Denies easy bruising.  INTEGUMENTARY:  No acne, rash, lesions.  MUSCULOSKELETAL:  No joint pain in the neck, back.  Complaining of left hip pain.  NEUROLOGIC:  Denies any vertigo, ataxia.  PSYCHIATRIC:  Denies any ADD, OCD.   PHYSICAL EXAMINATION: VITAL SIGNS:  Temperature 98.4, pulse 79, respirations 18, blood pressure 118/40, pulse ox 97%.  GENERAL APPEARANCE:  The patient is not under acute distress.  Moderately built and thin-looking Caucasian female.  HEENT:  Normocephalic.  Pupils are equally reacting to light and accommodation.  No scleral icterus.  No conjunctival injection.  No postnasal drip.  NECK:  Supple.  No JVD, no thyromegaly.  No lymphadenopathy.  LUNGS:  Positive rales and rhonchi at the bases, more on the right than the left, moderate air entry.  CARDIAC:  S1, S2 normal.  Regular rate and rhythm.  No murmurs.  GASTROINTESTINAL:  Soft.  Bowel sounds are positive in all four quadrants.  Nontender, nondistended.  No masses felt.  No hepatosplenomegaly.  NEUROLOGIC:  Awake, alert, oriented x 3.  Motor and sensory are grossly intact.  Reflexes are 2+.  EXTREMITIES:  3+ pitting edema is present.  No cyanosis.  No  clubbing.  SKIN:  Warm to touch.  Normal turgor.  No rashes.  No lesions.  On the right side of the anterior chest wall intact Port-A-Cath is present.  Examination of the back, the left side of the middle back stage II to III decubitus ulcer is present.  The left hip area is healing well with minimal erythema, no discharge and Steri-Strips are present.  MUSCULOSKELETAL:  No joint effusion, tenderness except left hip area as she had recent hip fracture and repair.   LABORATORY AND IMAGING STUDIES:  A 12-lead EKG is revealing a normal sinus rhythm.  Chest x-ray rotated shows right-sided pleural effusion.  Glucose is 108, BNP is 1810, BUN 11, creatinine 0.63, sodium 136, potassium 3.5, chloride 104, CO2 28.  GFR greater than 60.  Anion gap is 4.  Serum osmolality 272, calcium 7.5.  WBC is elevated at 20.1, which is probably from recently given Neupogen.  Hemoglobin 9.7, hematocrit 29.3, platelets are 466.  Urinalysis, yellow in color, hazy in appearance.  Negative ketones.  Nitrate and leukocyte esterase are negative.   ASSESSMENT AND PLAN:  An 81 year old thin-looking, Caucasian female brought into the ER for low blood pressure from the rehab center, will be admitted with the following assessment and plan.  1.  New onset congestive heart failure.  We will admit her to telemetry, cycle cardiac biomarkers.  We will provide her Lasix 40 mg IV q. 12 hours with close monitoring of the blood pressure.  Recent echocardiogram in February has revealed an ejection fraction of 55%.  Cardiology consult is placed.  THE PATIENT IS ALLERGIC TO ASPIRIN WHICH GIVES HER HIVES.  We will continue beta-blocker, but at a lower dose in view of low blood pressure with holding parameters.  2.  Recent history of left hip fracture status post surgery, currently getting rehabilitation.  We will put a PT consult regarding continuation of rehab.  3.  Stage II to III decubitus ulcer on the left side of the back.  We will reposition patient  every two hours.  4.  History of colon cancer on chemotherapy status post Neupogen.  5.  Hypertension.  Blood pressure is on the low-normal range.  We will continue close monitoring and hold off on her lisinopril.  6.  Chronic history of anemia.  Hemoglobin is stable at this time.  7.  We will provide her GI and DVT prophylaxis.  8.  CODE STATUS:  She is FULL CODE.  Son is medical power of attorney.   The plan of care was discussed in detail with the patient and her son at bedside.  They verbalized understanding of the plan.     ____________________________ Nicholes Mango, MD ag:ea D: 06/22/2013 01:03:32 ET T: 06/22/2013 02:06:35 ET JOB#: [id number]  cc: Nicholes Mango, MD, <Dictator> Kathlene November. Grayland Ormond, MD Outside Physician Nicholes Mango MD ELECTRONICALLY SIGNED 06/24/2013 5:58

## 2015-03-19 NOTE — Consult Note (Signed)
   Comments   Met with pt and her family (daughter and 2 sons). Family updated. Pt says her goal at this point is comfort. She is emphatic that she does not want rehab and would not be able to participate. Pt and family both recognize there is no plan for chemotx. We talked about hospice care. Pt would like to [facility] to the [facility] and family agrees with this plan. Will have [place], liaison speak with family about details.  45 minutes  Electronic Signatures: Borders, Kirt Boys (NP)  (Signed 23-Sep-14 11:42)  Authored: Palliative Care Phifer, Izora Gala (MD)  (Signed 23-Sep-14 12:20)  Authored: Palliative Care   Last Updated: 23-Sep-14 12:20 by Phifer, Izora Gala (MD)

## 2015-03-19 NOTE — Consult Note (Signed)
History of Present Illness:  Reason for Consult History of stage IV colon cancer, declining performance status.   HPI   Patient seen in consultation at the request of the family for further evaluation and goals of therapy.  She has not received chemotherapy since June 04, 2013.  Since that time patient has been admitted to the hospital approximately 4 times.  Her performance status has significantly declined.  She has pain "all over".  She has minimal PO intake.  She is profoundly weak and fatigued.  She has no neurologic complaints.  She has no chest pain or shortness of breath.  She denies any nausea, vomiting, constipation, or diarrhea.  Patient offers no further specific complaints today.  PFSH:  Additional Past Medical and Surgical History Hypertension, Hip surgery, surgery x2 for small bowel obstruction.  Family history: Negative and noncontributory.  Social history: Patient denies tobacco or alcohol.   Review of Systems:  Performance Status (ECOG) 4   Review of Systems   As per HPI. Otherwise, 10 point system review was negative.  NURSING NOTES: **Vital Signs.:   22-Sep-14 14:25   Vital Signs Type: Routine   Temperature Temperature (F): 97.9   Celsius: 36.6   Temperature Source: oral   Pulse Pulse: 91   Respirations Respirations: 22   Systolic BP Systolic BP: 893   Diastolic BP (mmHg) Diastolic BP (mmHg): 68   Mean BP: 81   Pulse Ox % Pulse Ox %: 95   Pulse Ox Activity Level: At rest   Oxygen Delivery: Room Air/ 21 %   Physical Exam:  Physical Exam General: cachectic, no acute distress. Eyes: anicteric sclera. Lungs: Clear to auscultation bilaterally. Heart: Regular rate and rhythm. No rubs, murmurs, or gallops. Abdomen: Soft, normoactive bowel sounds. Musculoskeletal: No edema, cyanosis, or clubbing. Neuro: Alert, answering all questions appropriately. Cranial nerves grossly intact. Skin: No rashes or petechiae noted. Psych: Normal affect.     Aspirin: Angioedema, Swelling    Metoprolol Succinate ER 25 mg oral tablet, extended release: 1 tab(s) orally 2 times a day. **Make sure extended release**, Status: Active, Quantity: 0, Refills: None   acetaminophen-HYDROcodone 325 mg-5 mg oral tablet: 1 tab(s) orally every 6 hours as needed for pain., Status: Active, Quantity: 0, Refills: None   potassium chloride 20 mEq tablet, extended release: 1 tab(s) orally 2 times a day x 30 days , Status: Active, Quantity: 60, Refills: 5   pantoprazole 40 mg delayed release tablet: 1 tab(s) orally once a day x 30 days , Status: Active, Quantity: 30, Refills: 5   ALPRAZolam 0.25 mg oral tablet: 1 tab(s) orally 2 times a day, As Needed - for Anxiety, Nervousness, Status: Active, Quantity: 60, Refills: 2   Tylenol 325 mg oral tablet: 2 tabs (650m) orally every 4 hours as needed for pain., Status: Active, Quantity: 0, Refills: None   furosemide 40 mg oral tablet: 1 tab(s) orally once a day as needed for swelling/edema., Status: Active, Quantity: 0, Refills: None   Reglan 10 mg oral tablet: 1 tab(s) orally 4 times a day (before meals and at bedtime), Status: Active, Quantity: 0, Refills: None   Lidocaine Viscous 2% mucous membrane solution: 15 milliliter(s) mucous membrane 4 times a day, Status: Active, Quantity: 0, Refills: None   Santyl 250 units/g topical ointment: Apply topically to affected area once a day, Status: Active, Quantity: 0, Refills: None   dukes magic mouthwash: swish and swallow 5 milliliter(s) orally 3 times a day as needed for thrush., Status: Active, Quantity:  0, Refills: None  Laboratory Results: Routine Chem:  22-Sep-14 03:59   Result Comment LABS - This specimen was collected through an   - indwelling catheter or arterial line.  - A minimum of 70ms of blood was wasted prior    - to collecting the sample.  Interpret  - results with caution.  Result(s) reported on 18 Aug 2013 at 04:26AM.  Glucose, Serum  100  BUN 10   Creatinine (comp)  0.56  Sodium, Serum  133  Potassium, Serum 3.5  Chloride, Serum 102  CO2, Serum 24  Calcium (Total), Serum  7.3  Anion Gap 7  Osmolality (calc) 266  eGFR (African American) >60  eGFR (Non-African American) >60 (eGFR values <686mmin/1.73 m2 may be an indication of chronic kidney disease (CKD). Calculated eGFR is useful in patients with stable renal function. The eGFR calculation will not be reliable in acutely ill patients when serum creatinine is changing rapidly. It is not useful in  patients on dialysis. 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 Plan:   1.  Colon cancer: Given patient's significantly decreased performance status, no further therapy is planned.  After lengthy discussion with her son, we will likely pursue comfort care and hospice home placement.  Have consulted palliative care for assistance.  Patient is DNR/DNI. Fever: No obvious source of infection at this point, could be related to underlying malignancy. Anemia: Hemoglobin stable.  Electronic Signatures: FiDelight HohMD)  (Signed 22-Sep-14 17:43)  Authored: HISTORY OF PRESENT ILLNESS, PFSH, ROS, NURSING NOTES, PE, ALLERGIES, HOME MEDICATIONS, LABS, ASSESSMENT AND PLAN   Last Updated: 22-Sep-14 17:43 by FiDelight HohMD)

## 2015-03-19 NOTE — Consult Note (Signed)
Brief Consult Note: Diagnosis: Fever, leukocytosis- improving, Stage IV colon cancer, profound malnutrition.   Patient was seen by consultant.   Consult note dictated.   Recommend further assessment or treatment.   Orders entered.   Discussed with Attending MD.   Comments: Rec swab T spine decub - likely source of infection cont augmentin start bactrim if MRSA identified. If palliative care decision made then can do short course of abx or stop altoghter.  Electronic Signatures: Angelena Form (MD)  (Signed 22-Sep-14 16:07)  Authored: Brief Consult Note   Last Updated: 22-Sep-14 16:07 by Angelena Form (MD)

## 2015-03-19 NOTE — Consult Note (Signed)
Chief Complaint:  Subjective/Chief Complaint less abdominal pain today, mild nausea, poor appetite. passing flatus, no bm.   VITAL SIGNS/ANCILLARY NOTES: **Vital Signs.:   03-Mar-14 13:11  Vital Signs Type Routine  Temperature Temperature (F) 97.2  Celsius 36.2  Temperature Source axillary  Pulse Pulse 66  Respirations Respirations 18  Systolic BP Systolic BP 159  Diastolic BP (mmHg) Diastolic BP (mmHg) 74  Mean BP 104  Pulse Ox % Pulse Ox % 95  Pulse Ox Activity Level  At rest  Oxygen Delivery Room Air/ 21 %   Brief Assessment:  Cardiac Regular   Respiratory clear BS   Gastrointestinal details normal No rebound tenderness  mild to moderate distension, mild diffuse discomfort, bowel sounds positive.   Lab Results: Routine Chem:  02-Mar-14 05:12   Glucose, Serum 69  BUN 11  Creatinine (comp)  0.57  Sodium, Serum  132  Potassium, Serum 3.8  Chloride, Serum  97  CO2, Serum 29  Calcium (Total), Serum  8.1  Anion Gap  6  Osmolality (calc) 262  eGFR (African American) >60  eGFR (Non-African American) >60 (eGFR values <40m/min/1.73 m2 may be an indication of chronic kidney disease (CKD). Calculated eGFR is useful in patients with stable renal function. The eGFR calculation will not be reliable in acutely ill patients when serum creatinine is changing rapidly. It is not useful in  patients on dialysis. The eGFR calculation may not be applicable to patients at the low and high extremes of body sizes, pregnant women, and vegetarians.)   Radiology Results: XRay:    02-Mar-14 07:42, Abdomen AP Only  Abdomen AP Only   REASON FOR EXAM:    h/o sbo, pain, nausea.  COMMENTS:       PROCEDURE: DXR - DXR ABDOMEN AP ONLY  - Jan 26 2013  7:42AM     RESULT: Comparisons:  None    Findings:      Supine radiograph of the abdomen is provided.    There is a nonspecific bowel gaspattern. There is no bowel dilatation to   suggest obstruction. There is no pathologic calcification  along the   expected course of the ureters. There is no evidence of pneumoperitoneum,   portal venous gas, or pneumatosis.  The osseous structures are unremarkable.    IMPRESSION:     Unremarkable KUB.    Dictation Site: 1        Verified By: HJennette Banker M.D., MD   Assessment/Plan:  Assessment/Plan:  Assessment 1) ileus s/p e lap for sbo.  peritoneal implants/carcinomatosis in the setting of h/o colon cancer.  new pelvic mass with increasing ca125.   Plan 1) slow resolution, now on reglan, clears.  recheck abd films in am, no new recs.   Electronic Signatures: SLoistine Simas(MD)  (Signed 03-Mar-14 21:58)  Authored: Chief Complaint, VITAL SIGNS/ANCILLARY NOTES, Brief Assessment, Lab Results, Radiology Results, Assessment/Plan   Last Updated: 03-Mar-14 21:58 by SLoistine Simas(MD)

## 2015-03-19 NOTE — Consult Note (Signed)
PATIENT NAME:  Jamie Fernandez, Jamie Fernandez MR#:  [id number] DATE OF BIRTH:  1928/04/07  DATE OF CONSULTATION:  01/17/2013  REFERRING PHYSICIAN:  Dr. Delight Hoh  CONSULTING PHYSICIAN:  Monica Becton, MD  REASON FOR CONSULT:  Medical management.   HISTORY OF PRESENT ILLNESS:  The patient is a 81 year old pleasant white female with a history of the colon cancer status post surgery in 12/2010 followed by chemo treatment through Port-A-Cath, as well as hypertension, paroxysmal atrial fibrillation, presented to the Emergency department with the complaints of abdominal pain. The patient has been experiencing this pain for the last one month on and off. On Tuesday the pain was significantly worse, uncontrollable, and continuous. Concerning this came to the Emergency Department. Workup in the Emergency Department revealed the patient has a small bowel obstruction, an NG tube was placed; however, no improvement with the output from the suction. While awaiting for the small bowel obstruction to improve, the patient developed atrial fibrillation with a rapid ventricular rate. Cardiology has been consulted. After giving the Lopressor, 3 doses, the atrial fibrillation resolved and converted back to normal sinus rhythm. Medicine has been consulted for further care of this patient.  PAST MEDICAL HISTORY: 1. Colon cancer of the ascending colon status post surgery in 02/2011, a large 7 cm mass at the ascending colon. 2. Hypertension. 3. Hyperlipidemia. 4. Debility..  ALLERGIES: ASPIRIN.  HOME MEDICATIONS: 1. Vitamin D3 1000 units once a day. 2. Multivitamin 1 tablet daily. 3. Tylenol 325 mg every 4 hours as needed. 4. Prochlorperazine 1 tablet every 8 hours. 5.  Narco 5/325 mg every 6 hours. 6. Zofran 4 mg every 4 hours as needed. 7. Lisinopril 40 mg daily. 8. Hydralazine 50 mg 2 times a day. 9. Furosemide 40 mg once a day.  Social history: No smoking, drinking alchohol or using illicit drugs. Lives by  herself. Family history: noncontributory. Review of systems  CONSTITUTIONAL: Denies any fever, chills. No visual changes. ENT: No difficulty swallowing or sinus congestion that he knows. RESPIRATORY: Shortness of breath with exertion. CARDIOVASCULAR: Increased swelling in the lower extremities.  GASTROINTESTINAL: positive for nausea or vomiting and abdominal pain. GENITOURINARY: Denies any hematuria, increased frequency or urgency of urination. HEMATOLOGY: Denies having any early bruising. NEUROLOGIC: Denies having any weakness in any part of the body. No paresthesias.     ASSESSMENT AND PLAN: The patient is an 81 year old female who comes to the Emergency Department with abdominal pain of 3 days duration and who was found to have a small bowel obstruction.  1. Hypertension. Currently well controlled. We will need to continue to monitor. 2. Atrial fibrillation with rapid ventricular rate. Currently converted back to normal sinus rhythm. However, Cardiology is following up with the patient. 3. Debility. We will involve the Physical Therapy and Occupational Therapy. 4. Small bowel obstruction. A plan to do the surgery tomorrow.  5. A history of colon cancer. No current issues. Plan to do the surgery tomorrow. 6. Keep the patient on deep venous thrombosis prophylaxis with Lovenox.  ____________________________ Monica Becton, MD pv:jm D: 01/17/2013 04:52:00 ET T: 01/17/2013 11:07:43 ET JOB#: [id number]  cc: Monica Becton, MD, <Dictator> Monica Becton MD ELECTRONICALLY SIGNED 01/20/2013 7:23
